# Patient Record
Sex: Female | Born: 1950 | Race: White | Hispanic: No | Smoking: Never smoker
Health system: Southern US, Community
[De-identification: ages and names within clinical notes are randomized; demographics above are authoritative.]

## PROBLEM LIST (undated history)

## (undated) DIAGNOSIS — E559 Vitamin D deficiency, unspecified: Secondary | ICD-10-CM

## (undated) DIAGNOSIS — C49A0 Gastrointestinal stromal tumor, unspecified site: Secondary | ICD-10-CM

## (undated) DIAGNOSIS — E079 Disorder of thyroid, unspecified: Secondary | ICD-10-CM

## (undated) DIAGNOSIS — M1990 Unspecified osteoarthritis, unspecified site: Secondary | ICD-10-CM

## (undated) DIAGNOSIS — D259 Leiomyoma of uterus, unspecified: Secondary | ICD-10-CM

## (undated) HISTORY — DX: Disorder of thyroid, unspecified: E07.9

## (undated) HISTORY — DX: Leiomyoma of uterus, unspecified: D25.9

## (undated) HISTORY — PX: DILATION AND CURETTAGE OF UTERUS: SHX78

## (undated) HISTORY — DX: Gastrointestinal stromal tumor, unspecified site: C49.A0

## (undated) HISTORY — DX: Vitamin D deficiency, unspecified: E55.9

## (undated) HISTORY — DX: Unspecified osteoarthritis, unspecified site: M19.90

---

## 2000-04-29 ENCOUNTER — Other Ambulatory Visit: Admission: RE | Admit: 2000-04-29 | Discharge: 2000-04-29 | Payer: Self-pay | Admitting: Obstetrics and Gynecology

## 2002-02-10 ENCOUNTER — Other Ambulatory Visit: Admission: RE | Admit: 2002-02-10 | Discharge: 2002-02-10 | Payer: Self-pay | Admitting: Obstetrics and Gynecology

## 2003-04-07 ENCOUNTER — Other Ambulatory Visit: Admission: RE | Admit: 2003-04-07 | Discharge: 2003-04-07 | Payer: Self-pay | Admitting: Obstetrics and Gynecology

## 2004-05-14 ENCOUNTER — Other Ambulatory Visit: Admission: RE | Admit: 2004-05-14 | Discharge: 2004-05-14 | Payer: Self-pay | Admitting: Obstetrics and Gynecology

## 2005-06-17 ENCOUNTER — Other Ambulatory Visit: Admission: RE | Admit: 2005-06-17 | Discharge: 2005-06-17 | Payer: Self-pay | Admitting: Obstetrics and Gynecology

## 2006-06-29 ENCOUNTER — Other Ambulatory Visit: Admission: RE | Admit: 2006-06-29 | Discharge: 2006-06-29 | Payer: Self-pay | Admitting: Obstetrics and Gynecology

## 2007-08-05 ENCOUNTER — Other Ambulatory Visit: Admission: RE | Admit: 2007-08-05 | Discharge: 2007-08-05 | Payer: Self-pay | Admitting: Obstetrics and Gynecology

## 2008-09-06 ENCOUNTER — Ambulatory Visit: Payer: Self-pay | Admitting: Gynecology

## 2009-11-06 ENCOUNTER — Ambulatory Visit: Payer: Self-pay | Admitting: Obstetrics and Gynecology

## 2009-11-06 ENCOUNTER — Other Ambulatory Visit: Admission: RE | Admit: 2009-11-06 | Discharge: 2009-11-06 | Payer: Self-pay | Admitting: Obstetrics and Gynecology

## 2009-11-07 ENCOUNTER — Ambulatory Visit: Payer: Self-pay | Admitting: Obstetrics and Gynecology

## 2009-11-13 ENCOUNTER — Encounter: Admission: RE | Admit: 2009-11-13 | Discharge: 2009-11-13 | Payer: Self-pay | Admitting: Obstetrics and Gynecology

## 2010-11-19 ENCOUNTER — Encounter: Payer: Self-pay | Admitting: Obstetrics and Gynecology

## 2010-11-28 ENCOUNTER — Other Ambulatory Visit: Payer: Self-pay | Admitting: Obstetrics and Gynecology

## 2010-11-28 NOTE — Telephone Encounter (Signed)
Patient has her yearly exam scheduled 01/22/11.  Last yearly was 11/06/09. Please advise regarding ok to refill. thanks

## 2011-01-02 ENCOUNTER — Encounter: Payer: Self-pay | Admitting: Gynecology

## 2011-01-02 DIAGNOSIS — E079 Disorder of thyroid, unspecified: Secondary | ICD-10-CM | POA: Insufficient documentation

## 2011-01-22 ENCOUNTER — Encounter: Payer: Self-pay | Admitting: Obstetrics and Gynecology

## 2011-02-07 ENCOUNTER — Ambulatory Visit (INDEPENDENT_AMBULATORY_CARE_PROVIDER_SITE_OTHER): Payer: BC Managed Care – PPO | Admitting: Obstetrics and Gynecology

## 2011-02-07 ENCOUNTER — Encounter: Payer: Self-pay | Admitting: Obstetrics and Gynecology

## 2011-02-07 VITALS — BP 124/72 | Ht 64.0 in | Wt 170.0 lb

## 2011-02-07 DIAGNOSIS — Z01419 Encounter for gynecological examination (general) (routine) without abnormal findings: Secondary | ICD-10-CM

## 2011-02-07 DIAGNOSIS — Z833 Family history of diabetes mellitus: Secondary | ICD-10-CM

## 2011-02-07 DIAGNOSIS — E039 Hypothyroidism, unspecified: Secondary | ICD-10-CM

## 2011-02-07 LAB — URINALYSIS W MICROSCOPIC + REFLEX CULTURE
Bilirubin Urine: NEGATIVE
Ketones, ur: NEGATIVE mg/dL
Specific Gravity, Urine: 1.015 (ref 1.005–1.030)
pH: 5.5 (ref 5.0–8.0)

## 2011-02-07 MED ORDER — SYNTHROID 112 MCG PO TABS: 112.0000 ug | ORAL_TABLET | Freq: Every day | ORAL | Status: DC

## 2011-02-07 NOTE — Progress Notes (Signed)
Patient came to see me today for her annual GYN exam. She is doing well without HRT. She is having no vaginal bleeding. She is having no pelvic pain. She is up-to-date on mammograms. She has elected to start bone densities at 63. We are treating her for hypothyroidism and she remains happy on her current dose of Synthroid. She has no other medical issues.  Physical examination: Anna Figueroa present. HEENT within normal limits. Neck: Thyroid not large. No masses. Supraclavicular nodes: not enlarged. Breasts: Examined in both sitting midline position. No skin changes and no masses. Abdomen: Soft no guarding rebound or masses or hernia. Pelvic: External: Within normal limits. BUS: Within normal limits. Vaginal:within normal limits. Good estrogen effect. No evidence of cystocele rectocele or enterocele. Cervix: clean. Uterus: Normal size and shape. Adnexa: No masses. Rectovaginal exam: Confirmatory and negative. Extremities: Within normal limits.  Assessment: Normal GYN exam. hypothyroidism  Plan: TSH checked patient call Monday for results before she refills her Synthroid.

## 2011-02-08 LAB — CBC WITH DIFFERENTIAL/PLATELET
Eosinophils Relative: 3 % (ref 0–5)
HCT: 43.6 % (ref 36.0–46.0)
Lymphocytes Relative: 29 % (ref 12–46)
Lymphs Abs: 1.5 10*3/uL (ref 0.7–4.0)
MCV: 91.6 fL (ref 78.0–100.0)
Monocytes Absolute: 0.3 10*3/uL (ref 0.1–1.0)
RBC: 4.76 MIL/uL (ref 3.87–5.11)
WBC: 5.3 10*3/uL (ref 4.0–10.5)

## 2012-02-13 ENCOUNTER — Encounter: Payer: Self-pay | Admitting: Gynecology

## 2012-02-17 ENCOUNTER — Encounter: Payer: Self-pay | Admitting: Gynecology

## 2012-03-08 ENCOUNTER — Other Ambulatory Visit (HOSPITAL_COMMUNITY)
Admission: RE | Admit: 2012-03-08 | Discharge: 2012-03-08 | Disposition: A | Discharge status: Home / Self Care | Payer: BC Managed Care – PPO | Source: Ambulatory Visit | Attending: Gynecology | Admitting: Gynecology

## 2012-03-08 ENCOUNTER — Ambulatory Visit (INDEPENDENT_AMBULATORY_CARE_PROVIDER_SITE_OTHER): Payer: BC Managed Care – PPO | Admitting: Gynecology

## 2012-03-08 ENCOUNTER — Encounter: Payer: Self-pay | Admitting: Gynecology

## 2012-03-08 VITALS — BP 120/74 | Ht 64.0 in | Wt 158.0 lb

## 2012-03-08 DIAGNOSIS — Z13220 Encounter for screening for lipoid disorders: Secondary | ICD-10-CM

## 2012-03-08 DIAGNOSIS — Z01419 Encounter for gynecological examination (general) (routine) without abnormal findings: Secondary | ICD-10-CM | POA: Insufficient documentation

## 2012-03-08 DIAGNOSIS — N952 Postmenopausal atrophic vaginitis: Secondary | ICD-10-CM

## 2012-03-08 DIAGNOSIS — E039 Hypothyroidism, unspecified: Secondary | ICD-10-CM

## 2012-03-08 DIAGNOSIS — Z1151 Encounter for screening for human papillomavirus (HPV): Secondary | ICD-10-CM | POA: Insufficient documentation

## 2012-03-08 DIAGNOSIS — E559 Vitamin D deficiency, unspecified: Secondary | ICD-10-CM | POA: Insufficient documentation

## 2012-03-08 LAB — CBC WITH DIFFERENTIAL/PLATELET
Eosinophils Relative: 2 % (ref 0–5)
HCT: 40.8 % (ref 36.0–46.0)
Lymphocytes Relative: 26 % (ref 12–46)
Lymphs Abs: 1.2 10*3/uL (ref 0.7–4.0)
MCV: 84.6 fL (ref 78.0–100.0)
Neutro Abs: 2.8 10*3/uL (ref 1.7–7.7)
Platelets: 262 10*3/uL (ref 150–400)
RBC: 4.82 MIL/uL (ref 3.87–5.11)
WBC: 4.5 10*3/uL (ref 4.0–10.5)

## 2012-03-08 LAB — HEMOGLOBIN A1C: Hgb A1c MFr Bld: 6.3 % — ABNORMAL HIGH (ref <5.7)

## 2012-03-08 LAB — COMPREHENSIVE METABOLIC PANEL
ALT: 15 U/L (ref 0–35)
Albumin: 4.2 g/dL (ref 3.5–5.2)
CO2: 30 mEq/L (ref 19–32)
Calcium: 9.3 mg/dL (ref 8.4–10.5)
Chloride: 105 mEq/L (ref 96–112)
Creat: 0.98 mg/dL (ref 0.50–1.10)
Potassium: 4.2 mEq/L (ref 3.5–5.3)
Total Protein: 6.7 g/dL (ref 6.0–8.3)

## 2012-03-08 LAB — TSH: TSH: 1.876 u[IU]/mL (ref 0.350–4.500)

## 2012-03-08 LAB — LIPID PANEL: LDL Cholesterol: 110 mg/dL — ABNORMAL HIGH (ref 0–99)

## 2012-03-08 NOTE — Patient Instructions (Signed)
Follow up for lab results Follow up for annual exam in one year

## 2012-03-08 NOTE — Addendum Note (Signed)
Addended by: Dayna Barker on: 03/08/2012 10:09 AM   Modules accepted: Orders

## 2012-03-08 NOTE — Progress Notes (Signed)
Anna Figueroa [birth year] [id number]        62 y.o.  [id number] for annual exam.  Former patient Dr. Eda Paschal is without complaints.  Past medical history,surgical history, medications, allergies, family history and social history were all reviewed and documented in the EPIC chart. ROS:  Was performed and pertinent positives and negatives are included in the history.  Exam: Kim assistant Filed Vitals:   03/08/12 0909  BP: 120/74  Height: 5\' 4"  (1.626 m)  Weight: 158 lb (71.668 kg)   General appearance  Normal Skin grossly normal Head/Neck normal with no cervical or supraclavicular adenopathy thyroid normal Lungs  clear Cardiac RR, without RMG Abdominal  soft, nontender, without masses, organomegaly or hernia. Well-healed midline scar Breasts  examined lying and sitting without masses, retractions, discharge or axillary adenopathy. Pelvic  Ext/BUS/vagina  normal with atrophic changes  Cervix  normal with atrophic changes  Uterus  anteverted, normal size, shape and contour, midline and mobile nontender   Adnexa  Without masses or tenderness    Anus and perineum  normal   Rectovaginal  normal sphincter tone without palpated masses or tenderness.    Assessment/Plan:  62 y.o. [id number] female for annual exam.   1. Postmenopausal with atrophic genital changes. Patient asymptomatic without significant hot flushes, night sweats or vaginal symptoms. No bleeding. We'll continue to observe. Patient knows to report any bleeding. 2. Hypothyroidism. Patient was diagnosed with hypothyroidism and is on thyroid replacement. We'll check TSH today. Continue on Synthroid as prescribed. 3. Glucose control. Patient had a hemoglobin A1c of 6.1 last year. Does have a family history of diabetes. Was using diet and exercise to control. We'll check glucose and hemoglobin A1c now. 4. Mammography 01/2012. Continue with annual mammography. SBE monthly reviewed. 5. Pap smear 10/2009. Pap/HPV done today. No  history of significant abnormalities. Plan five-year repeat assuming this Pap smear is negative. 6. DEXA. Patient's never had a DEXA and plans to do at age 12. She's discussed this with Dr. Eda Paschal and this is what they agreed upon. I did review the risks of osteoporosis and fracture. She has no strong risk factors. We'll check vitamin D level today. Calcium vitamin D discussed. 7. Colonoscopy. Patients never had a colonoscopy implants at age 34. I reviewed current screening guidelines to start at age 25. I reviewed the benefit of early detection and removal of precancerous lesions. Patient clearly understands and declines colonoscopy.  OC Light kit given to check her stool for blood. Patient knows importance of returning this in calling to make sure we've received it and it is negative. 8. Health maintenance. Patient has no primary physician. I did discuss obtaining one sometime in the next year or 2. We'll check a CBC comprehensive metabolic panel lipid profile glucose TSH vitamin D and hepatitis C screen as she was born in the at risk age interval.      Dara Lords MD, 9:51 AM 03/08/2012

## 2012-03-09 LAB — URINALYSIS W MICROSCOPIC + REFLEX CULTURE
Bacteria, UA: NONE SEEN
Bilirubin Urine: NEGATIVE
Casts: NONE SEEN
Crystals: NONE SEEN
Glucose, UA: NEGATIVE mg/dL
Hgb urine dipstick: NEGATIVE
Ketones, ur: NEGATIVE mg/dL
Specific Gravity, Urine: 1.021 (ref 1.005–1.030)
pH: 6 (ref 5.0–8.0)

## 2012-05-31 ENCOUNTER — Other Ambulatory Visit: Payer: Self-pay | Admitting: Gynecology

## 2013-03-02 DIAGNOSIS — Z0289 Encounter for other administrative examinations: Secondary | ICD-10-CM

## 2013-07-06 ENCOUNTER — Telehealth: Payer: Self-pay | Admitting: *Deleted

## 2013-07-06 DIAGNOSIS — Z01419 Encounter for gynecological examination (general) (routine) without abnormal findings: Secondary | ICD-10-CM

## 2013-07-06 DIAGNOSIS — R5383 Other fatigue: Principal | ICD-10-CM

## 2013-07-06 DIAGNOSIS — R5381 Other malaise: Secondary | ICD-10-CM

## 2013-07-06 NOTE — Telephone Encounter (Signed)
Okay to have drawn before her appointment. If no one else is doing routine lab work then I would suggest coming for a fasting CBC, comprehensive metabolic panel, lipid profile, hemoglobin A1c TSH, vitamin D.

## 2013-07-06 NOTE — Telephone Encounter (Signed)
Pt has annual scheduled 08/26/13 would like to have TSH level checked before then. Pt c/o sluggish, thoughts are that her level maybe off. Please advise

## 2013-07-07 NOTE — Telephone Encounter (Signed)
Patient informed and labs put in.

## 2013-07-11 ENCOUNTER — Other Ambulatory Visit: Payer: BC Managed Care – PPO

## 2013-07-11 ENCOUNTER — Other Ambulatory Visit: Payer: Self-pay | Admitting: Gynecology

## 2013-07-11 DIAGNOSIS — R5383 Other fatigue: Principal | ICD-10-CM

## 2013-07-11 DIAGNOSIS — R5381 Other malaise: Secondary | ICD-10-CM

## 2013-07-12 ENCOUNTER — Telehealth: Payer: Self-pay

## 2013-07-12 ENCOUNTER — Other Ambulatory Visit: Payer: Self-pay | Admitting: Gynecology

## 2013-07-12 DIAGNOSIS — D72819 Decreased white blood cell count, unspecified: Secondary | ICD-10-CM

## 2013-07-12 DIAGNOSIS — E782 Mixed hyperlipidemia: Secondary | ICD-10-CM

## 2013-07-12 DIAGNOSIS — D696 Thrombocytopenia, unspecified: Secondary | ICD-10-CM

## 2013-07-12 DIAGNOSIS — E781 Pure hyperglyceridemia: Secondary | ICD-10-CM

## 2013-07-12 LAB — CBC WITH DIFFERENTIAL/PLATELET
BASOS PCT: 3 % — AB (ref 0–1)
Basophils Absolute: 0.1 10*3/uL (ref 0.0–0.1)
EOS ABS: 0.1 10*3/uL (ref 0.0–0.7)
Eosinophils Relative: 2 % (ref 0–5)
HCT: 41.3 % (ref 36.0–46.0)
HEMOGLOBIN: 13.8 g/dL (ref 12.0–15.0)
Lymphocytes Relative: 55 % — ABNORMAL HIGH (ref 12–46)
Lymphs Abs: 2.1 10*3/uL (ref 0.7–4.0)
MCH: 28.2 pg (ref 26.0–34.0)
MCHC: 33.4 g/dL (ref 30.0–36.0)
MCV: 84.5 fL (ref 78.0–100.0)
MONOS PCT: 20 % — AB (ref 3–12)
Monocytes Absolute: 0.8 10*3/uL (ref 0.1–1.0)
NEUTROS PCT: 20 % — AB (ref 43–77)
Neutro Abs: 0.8 10*3/uL — ABNORMAL LOW (ref 1.7–7.7)
Platelets: 133 10*3/uL — ABNORMAL LOW (ref 150–400)
RBC: 4.89 MIL/uL (ref 3.87–5.11)
RDW: 14.2 % (ref 11.5–15.5)
WBC: 3.8 10*3/uL — ABNORMAL LOW (ref 4.0–10.5)

## 2013-07-12 LAB — COMPREHENSIVE METABOLIC PANEL
ALK PHOS: 66 U/L (ref 39–117)
ALT: 42 U/L — ABNORMAL HIGH (ref 0–35)
AST: 61 U/L — AB (ref 0–37)
Albumin: 3.8 g/dL (ref 3.5–5.2)
BUN: 19 mg/dL (ref 6–23)
CO2: 27 mEq/L (ref 19–32)
CREATININE: 0.97 mg/dL (ref 0.50–1.10)
Calcium: 8.8 mg/dL (ref 8.4–10.5)
Chloride: 103 mEq/L (ref 96–112)
Glucose, Bld: 105 mg/dL — ABNORMAL HIGH (ref 70–99)
POTASSIUM: 4.2 meq/L (ref 3.5–5.3)
Sodium: 141 mEq/L (ref 135–145)
Total Bilirubin: 0.9 mg/dL (ref 0.2–1.2)
Total Protein: 6.4 g/dL (ref 6.0–8.3)

## 2013-07-12 LAB — HEMOGLOBIN A1C
HEMOGLOBIN A1C: 6.2 % — AB (ref <5.7)
Mean Plasma Glucose: 131 mg/dL — ABNORMAL HIGH (ref <117)

## 2013-07-12 LAB — LIPID PANEL
CHOLESTEROL: 179 mg/dL (ref 0–200)
HDL: 45 mg/dL (ref >39)
LDL CALC: 93 mg/dL (ref 0–99)
Total CHOL/HDL Ratio: 4 Ratio
Triglycerides: 205 mg/dL — ABNORMAL HIGH (ref <150)
VLDL: 41 mg/dL — AB (ref 0–40)

## 2013-07-12 LAB — TSH: TSH: 3.135 u[IU]/mL (ref 0.350–4.500)

## 2013-07-12 LAB — VITAMIN D 25 HYDROXY (VIT D DEFICIENCY, FRACTURES): VIT D 25 HYDROXY: 45 ng/mL (ref 30–89)

## 2013-07-12 NOTE — Telephone Encounter (Signed)
Message copied by Ramond Craver on Tue Jul 12, 2013  2:58 PM ------      Message from: Anastasio Auerbach      Created: Tue Jul 12, 2013  7:53 AM       Tell patient:      #1 her platelet count and white blood cell count or little low. Recommend repeating a CBC.      #2 cholesterol and LDL look good. Her triglycerides are elevated but this was not fasting that would be okay. If it was fasting would recommend repeating a fasting lipid profile in 3-6 months with attention to fat in her diet. She may want to add omega-3 fish oil OTC      #3 her glucose and her hemoglobin A1c are elevated again consistent with diabetes. She needs to make an appointment to be seen by either an internist or endocrinologist to be followed for this and possibly treated. This is very important for her overall health. ------

## 2013-07-12 NOTE — Telephone Encounter (Signed)
Patient advised.

## 2013-07-12 NOTE — Telephone Encounter (Signed)
Patient questions is you could recommend an internist or PCP for her?

## 2013-07-12 NOTE — Telephone Encounter (Signed)
One of the newer physicians at [facility]

## 2013-07-15 ENCOUNTER — Other Ambulatory Visit: Payer: Self-pay | Admitting: Gynecology

## 2013-07-15 DIAGNOSIS — R7989 Other specified abnormal findings of blood chemistry: Secondary | ICD-10-CM

## 2013-07-15 DIAGNOSIS — R945 Abnormal results of liver function studies: Principal | ICD-10-CM

## 2013-08-01 ENCOUNTER — Telehealth: Payer: Self-pay | Admitting: *Deleted

## 2013-08-01 NOTE — Telephone Encounter (Signed)
I called [place] office and left a message for referral coordinator Di Kindle to call regarding this new patient.

## 2013-08-01 NOTE — Telephone Encounter (Signed)
Pt has found a PCP Dr.Daniel Sharlett Iles which is not accepting new patients with out referral. Pt asked if you would be willing to refer her? Pt will have medicare insurance soon.  Please advise

## 2013-08-01 NOTE — Telephone Encounter (Signed)
Okay for referral?

## 2013-08-01 NOTE — Telephone Encounter (Signed)
Di Kindle called back and asked me to fax office notes for  Dr.Paterson to review and Arville Go will call me back with answer regarding referral. Pt aware of the below note.

## 2013-08-16 NOTE — Telephone Encounter (Signed)
Appointment Jan 2016 per [place] office.

## 2013-08-26 ENCOUNTER — Encounter: Payer: Self-pay | Admitting: Gynecology

## 2013-08-26 ENCOUNTER — Ambulatory Visit (INDEPENDENT_AMBULATORY_CARE_PROVIDER_SITE_OTHER): Payer: BC Managed Care – PPO | Admitting: Gynecology

## 2013-08-26 VITALS — BP 134/80 | Ht 64.0 in | Wt 149.0 lb

## 2013-08-26 DIAGNOSIS — Z01419 Encounter for gynecological examination (general) (routine) without abnormal findings: Secondary | ICD-10-CM

## 2013-08-26 LAB — COMPREHENSIVE METABOLIC PANEL
ALT: 17 U/L (ref 0–35)
AST: 22 U/L (ref 0–37)
Albumin: 4.3 g/dL (ref 3.5–5.2)
Alkaline Phosphatase: 54 U/L (ref 39–117)
BUN: 20 mg/dL (ref 6–23)
CALCIUM: 9.4 mg/dL (ref 8.4–10.5)
CHLORIDE: 103 meq/L (ref 96–112)
CO2: 27 meq/L (ref 19–32)
CREATININE: 0.99 mg/dL (ref 0.50–1.10)
Glucose, Bld: 94 mg/dL (ref 70–99)
Potassium: 4.4 mEq/L (ref 3.5–5.3)
Sodium: 138 mEq/L (ref 135–145)
Total Bilirubin: 1.2 mg/dL (ref 0.2–1.2)
Total Protein: 7.1 g/dL (ref 6.0–8.3)

## 2013-08-26 LAB — CBC WITH DIFFERENTIAL/PLATELET
BASOS ABS: 0.1 10*3/uL (ref 0.0–0.1)
Basophils Relative: 1 % (ref 0–1)
Eosinophils Absolute: 0.2 10*3/uL (ref 0.0–0.7)
Eosinophils Relative: 4 % (ref 0–5)
HEMATOCRIT: 39.5 % (ref 36.0–46.0)
HEMOGLOBIN: 13.4 g/dL (ref 12.0–15.0)
LYMPHS PCT: 36 % (ref 12–46)
Lymphs Abs: 1.8 10*3/uL (ref 0.7–4.0)
MCH: 28.8 pg (ref 26.0–34.0)
MCHC: 33.9 g/dL (ref 30.0–36.0)
MCV: 84.9 fL (ref 78.0–100.0)
MONO ABS: 0.4 10*3/uL (ref 0.1–1.0)
MONOS PCT: 8 % (ref 3–12)
NEUTROS ABS: 2.6 10*3/uL (ref 1.7–7.7)
Neutrophils Relative %: 51 % (ref 43–77)
Platelets: 226 10*3/uL (ref 150–400)
RBC: 4.65 MIL/uL (ref 3.87–5.11)
RDW: 14.5 % (ref 11.5–15.5)
WBC: 5 10*3/uL (ref 4.0–10.5)

## 2013-08-26 MED ORDER — SYNTHROID 112 MCG PO TABS: ORAL_TABLET | ORAL | Status: AC

## 2013-08-26 NOTE — Progress Notes (Signed)
Anna Figueroa 07-Dec-1950 [id number]        62 y.o.  [id number] for annual exam.  Several issues noted below.  Past medical history,surgical history, problem list, medications, allergies, family history and social history were all reviewed and documented as reviewed in the EPIC chart.  ROS:  12 system ROS performed with pertinent positives and negatives included in the history, assessment and plan.   Additional significant findings :  None   Exam: Kim Counsellor Vitals:   08/26/13 0831  BP: 134/80  Height: 5\' 4"  (1.626 m)  Weight: 149 lb (67.586 kg)   General appearance:  Normal affect, orientation and appearance. Skin: Grossly normal HEENT: Without gross lesions.  No cervical or supraclavicular adenopathy. Thyroid normal.  Lungs:  Clear without wheezing, rales or rhonchi Cardiac: RR, without RMG Abdominal:  Soft, nontender, without masses, guarding, rebound, organomegaly or hernia Breasts:  Examined lying and sitting without masses, retractions, discharge or axillary adenopathy. Pelvic:  Ext/BUS/vagina with generalized atrophic changes  Cervix with atrophic changes  Uterus anteverted, normal size, shape and contour, midline and mobile nontender   Adnexa  Without masses or tenderness    Anus and perineum  Normal   Rectovaginal  Normal sphincter tone without palpated masses or tenderness.    Assessment/Plan:  63 y.o. [id number] female for annual exam. .   1. Postmenopausal/atrophic genital changes. Patient without significant symptoms of hot flushes, night sweats, vaginal dryness or dyspareunia. No vaginal bleeding. Will continue to monitor. Report any vaginal bleeding. 2. Hypothyroid. Recent TSH within normal range. I refilled her Synthroid x1 year. 3. Pap/HPV negative 2014. No Pap smear done today. No history of significant abnormal Pap smears. Plan repeat Pap smear at 3-5 year interval. 4. Mammography 01/2012. Schedule screening mammogram. SBE monthly reviewed. 5. DEXA  never. Patient plans this coming year after she turned 20. Increased calcium vitamin D reviewed. Vitamin D level 45 06/2013. 6. Colonoscopy never. Patient plans to when she turned 89. We previously reviewed screening recommendations and she has always declined until now. 7. Health maintenance. Recent lab work showed elevated transaminases, low white count and platelet count. Will recheck comprehensive metabolic panel and CBC today. She does have an appointment to see internal medicine doctor will followup with him in reference to this. Glucoses and hemoglobin A1c have all so been marginally elevated and she knows importance of followup for this. Lastly her blood pressure is 134/80 she'll continue to monitor this and follow up with internal medicine. Followup here in one year, sooner as needed.    Note: This document was prepared with digital dictation and possible smart phrase technology. Any transcriptional errors that result from this process are unintentional.   Anastasio Auerbach MD, 8:58 AM 08/26/2013

## 2013-08-26 NOTE — Patient Instructions (Addendum)
Followup for lab work.  Followup with your appointment with the internal medicine doctor.  Call to Schedule your mammogram  Facilities in [place]: 1)  [facility], [address] Phone: [phone] 2)  [facility]. [address] [place] Phone: [phone] 3)  Dr. Isaiah Blakes at [facility] [address] Phone: [phone]     Mammogram A mammogram is an X-ray test to find changes in a woman's breast. You should get a mammogram if:  You are 76 years of age or older  You have risk factors.   Your doctor recommends that you have one.  BEFORE THE TEST  Do not schedule the test the week before your period, especially if your breasts are sore during this time.  On the day of your mammogram:  Wash your breasts and armpits well. After washing, do not put on any deodorant or talcum powder on until after your test.   Eat and drink as you usually do.   Take your medicines as usual.   If you are diabetic and take insulin, make sure you:   Eat before coming for your test.   Take your insulin as usual.   If you cannot keep your appointment, call before the appointment to cancel. Schedule another appointment.  TEST  You will need to undress from the waist up. You will put on a hospital gown.   Your breast will be put on the mammogram machine, and it will press firmly on your breast with a piece of plastic called a compression paddle. This will make your breast flatter so that the machine can X-ray all parts of your breast.   Both breasts will be X-rayed. Each breast will be X-rayed from above and from the side. An X-ray might need to be taken again if the picture is not good enough.   The mammogram will last about 15 to 30 minutes.  AFTER THE TEST Finding out the results of your test Ask when your test results will be ready. Make sure you get your test results.  Document  Released: 04/11/2008 Document Revised: 01/02/2011 Document Reviewed: 04/11/2008 [facility] Patient Information 2012 [place].   You may obtain a copy of any labs that were done today by logging onto MyChart as outlined in the instructions provided with your AVS (after visit summary). The office will not call with normal lab results but certainly if there are any significant abnormalities then we will contact you.   Health Maintenance, Female A healthy lifestyle and preventative care can promote health and wellness.  Maintain regular health, dental, and eye exams.  Eat a healthy diet. Foods like vegetables, fruits, whole grains, low-fat dairy products, and lean protein foods contain the nutrients you need without too many calories. Decrease your intake of foods high in solid fats, added sugars, and salt. Get information about a proper diet from your caregiver, if necessary.  Regular physical exercise is one of the most important things you can do for your health. Most adults should get at least 150 minutes of moderate-intensity exercise (any activity that increases your heart rate and causes you to sweat) each week. In addition, most adults need muscle-strengthening exercises on 2 or more days a week.   Maintain a healthy weight. The body mass index (BMI) is a screening tool to identify possible weight problems. It provides an estimate of body fat based on height and weight. Your caregiver can  help determine your BMI, and can help you achieve or maintain a healthy weight. For adults 20 years and older:  A BMI below 18.5 is considered underweight.  A BMI of 18.5 to 24.9 is normal.  A BMI of 25 to 29.9 is considered overweight.  A BMI of 30 and above is considered obese.  Maintain normal blood lipids and cholesterol by exercising and minimizing your intake of saturated fat. Eat a balanced diet with plenty of fruits and vegetables. Blood tests for lipids and cholesterol should begin at age  64 and be repeated every 5 years. If your lipid or cholesterol levels are high, you are over 50, or you are a high risk for heart disease, you may need your cholesterol levels checked more frequently.Ongoing high lipid and cholesterol levels should be treated with medicines if diet and exercise are not effective.  If you smoke, find out from your caregiver how to quit. If you do not use tobacco, do not start.  Lung cancer screening is recommended for adults aged 50 80 years who are at high risk for developing lung cancer because of a history of smoking. Yearly low-dose computed tomography (CT) is recommended for people who have at least a 30-pack-year history of smoking and are a current smoker or have quit within the past 15 years. A pack year of smoking is smoking an average of 1 pack of cigarettes a day for 1 year (for example: 1 pack a day for 30 years or 2 packs a day for 15 years). Yearly screening should continue until the smoker has stopped smoking for at least 15 years. Yearly screening should also be stopped for people who develop a health problem that would prevent them from having lung cancer treatment.  If you are pregnant, do not drink alcohol. If you are breastfeeding, be very cautious about drinking alcohol. If you are not pregnant and choose to drink alcohol, do not exceed 1 drink per day. One drink is considered to be 12 ounces (355 mL) of beer, 5 ounces (148 mL) of wine, or 1.5 ounces (44 mL) of liquor.  Avoid use of street drugs. Do not share needles with anyone. Ask for help if you need support or instructions about stopping the use of drugs.  High blood pressure causes heart disease and increases the risk of stroke. Blood pressure should be checked at least every 1 to 2 years. Ongoing high blood pressure should be treated with medicines, if weight loss and exercise are not effective.  If you are 64 to 65 years old, ask your caregiver if you should take aspirin to prevent  strokes.  Diabetes screening involves taking a blood sample to check your fasting blood sugar level. This should be done once every 3 years, after age 4, if you are within normal weight and without risk factors for diabetes. Testing should be considered at a younger age or be carried out more frequently if you are overweight and have at least 1 risk factor for diabetes.  Breast cancer screening is essential preventative care for women. You should practice "breast self-awareness." This means understanding the normal appearance and feel of your breasts and may include breast self-examination. Any changes detected, no matter how small, should be reported to a caregiver. Women in their 22s and 30s should have a clinical breast exam (CBE) by a caregiver as part of a regular health exam every 1 to 3 years. After age 91, women should have a CBE every year. Starting at  age 3, women should consider having a mammogram (breast X-ray) every year. Women who have a family history of breast cancer should talk to their caregiver about genetic screening. Women at a high risk of breast cancer should talk to their caregiver about having an MRI and a mammogram every year.  Breast cancer gene (BRCA)-related cancer risk assessment is recommended for women who have family members with BRCA-related cancers. BRCA-related cancers include breast, ovarian, tubal, and peritoneal cancers. Having family members with these cancers may be associated with an increased risk for harmful changes (mutations) in the breast cancer genes BRCA1 and BRCA2. Results of the assessment will determine the need for genetic counseling and BRCA1 and BRCA2 testing.  The Pap test is a screening test for cervical cancer. Women should have a Pap test starting at age 20. Between ages 61 and 37, Pap tests should be repeated every 2 years. Beginning at age 64, you should have a Pap test every 3 years as long as the past 3 Pap tests have been normal. If you had a  hysterectomy for a problem that was not cancer or a condition that could lead to cancer, then you no longer need Pap tests. If you are between ages 54 and 83, and you have had normal Pap tests going back 10 years, you no longer need Pap tests. If you have had past treatment for cervical cancer or a condition that could lead to cancer, you need Pap tests and screening for cancer for at least 20 years after your treatment. If Pap tests have been discontinued, risk factors (such as a new sexual partner) need to be reassessed to determine if screening should be resumed. Some women have medical problems that increase the chance of getting cervical cancer. In these cases, your caregiver may recommend more frequent screening and Pap tests.  The human papillomavirus (HPV) test is an additional test that may be used for cervical cancer screening. The HPV test looks for the virus that can cause the cell changes on the cervix. The cells collected during the Pap test can be tested for HPV. The HPV test could be used to screen women aged 89 years and older, and should be used in women of any age who have unclear Pap test results. After the age of 34, women should have HPV testing at the same frequency as a Pap test.  Colorectal cancer can be detected and often prevented. Most routine colorectal cancer screening begins at the age of 20 and continues through age 51. However, your caregiver may recommend screening at an earlier age if you have risk factors for colon cancer. On a yearly basis, your caregiver may provide home test kits to check for hidden blood in the stool. Use of a small camera at the end of a tube, to directly examine the colon (sigmoidoscopy or colonoscopy), can detect the earliest forms of colorectal cancer. Talk to your caregiver about this at age 27, when routine screening begins. Direct examination of the colon should be repeated every 5 to 10 years through age 55, unless early forms of pre-cancerous  polyps or small growths are found.  Hepatitis C blood testing is recommended for all people born from [birth year] through 1967 and any individual with known risks for hepatitis C.  Practice safe sex. Use condoms and avoid high-risk sexual practices to reduce the spread of sexually transmitted infections (STIs). Sexually active women aged 69 and younger should be checked for Chlamydia, which is a common sexually transmitted  infection. Older women with new or multiple partners should also be tested for Chlamydia. Testing for other STIs is recommended if you are sexually active and at increased risk.  Osteoporosis is a disease in which the bones lose minerals and strength with aging. This can result in serious bone fractures. The risk of osteoporosis can be identified using a bone density scan. Women ages 35 and over and women at risk for fractures or osteoporosis should discuss screening with their caregivers. Ask your caregiver whether you should be taking a calcium supplement or vitamin D to reduce the rate of osteoporosis.  Menopause can be associated with physical symptoms and risks. Hormone replacement therapy is available to decrease symptoms and risks. You should talk to your caregiver about whether hormone replacement therapy is right for you.  Use sunscreen. Apply sunscreen liberally and repeatedly throughout the day. You should seek shade when your shadow is shorter than you. Protect yourself by wearing long sleeves, pants, a wide-brimmed hat, and sunglasses year round, whenever you are outdoors.  Notify your caregiver of new moles or changes in moles, especially if there is a change in shape or color. Also notify your caregiver if a mole is larger than the size of a pencil eraser.  Stay current with your immunizations. Document Released: 07/29/2010 Document Revised: 05/10/2012 Document Reviewed: 07/29/2010 [facility] Patient Information 2014 [place].

## 2013-08-30 ENCOUNTER — Encounter: Payer: Self-pay | Admitting: Obstetrics and Gynecology

## 2013-11-28 ENCOUNTER — Encounter: Payer: Self-pay | Admitting: Gynecology

## 2014-07-21 ENCOUNTER — Encounter: Payer: Self-pay | Admitting: Gynecology

## 2014-11-17 ENCOUNTER — Other Ambulatory Visit: Payer: Self-pay | Admitting: Gynecology

## 2015-01-28 DIAGNOSIS — D219 Benign neoplasm of connective and other soft tissue, unspecified: Secondary | ICD-10-CM

## 2015-01-28 HISTORY — DX: Benign neoplasm of connective and other soft tissue, unspecified: D21.9

## 2015-12-28 DIAGNOSIS — H353131 Nonexudative age-related macular degeneration, bilateral, early dry stage: Secondary | ICD-10-CM | POA: Diagnosis not present

## 2015-12-28 DIAGNOSIS — H43813 Vitreous degeneration, bilateral: Secondary | ICD-10-CM | POA: Diagnosis not present

## 2015-12-28 DIAGNOSIS — H2513 Age-related nuclear cataract, bilateral: Secondary | ICD-10-CM | POA: Diagnosis not present

## 2015-12-28 DIAGNOSIS — H04123 Dry eye syndrome of bilateral lacrimal glands: Secondary | ICD-10-CM | POA: Diagnosis not present

## 2016-01-11 DIAGNOSIS — R7309 Other abnormal glucose: Secondary | ICD-10-CM | POA: Diagnosis not present

## 2016-01-11 DIAGNOSIS — E038 Other specified hypothyroidism: Secondary | ICD-10-CM | POA: Diagnosis not present

## 2016-01-11 DIAGNOSIS — E559 Vitamin D deficiency, unspecified: Secondary | ICD-10-CM | POA: Diagnosis not present

## 2016-01-18 DIAGNOSIS — R7309 Other abnormal glucose: Secondary | ICD-10-CM | POA: Diagnosis not present

## 2016-01-18 DIAGNOSIS — E038 Other specified hypothyroidism: Secondary | ICD-10-CM | POA: Diagnosis not present

## 2016-01-18 DIAGNOSIS — M1990 Unspecified osteoarthritis, unspecified site: Secondary | ICD-10-CM | POA: Diagnosis not present

## 2016-01-18 DIAGNOSIS — Z6826 Body mass index (BMI) 26.0-26.9, adult: Secondary | ICD-10-CM | POA: Diagnosis not present

## 2016-01-18 DIAGNOSIS — Z0000 Encounter for general adult medical examination without abnormal findings: Secondary | ICD-10-CM | POA: Diagnosis not present

## 2016-01-18 DIAGNOSIS — E559 Vitamin D deficiency, unspecified: Secondary | ICD-10-CM | POA: Diagnosis not present

## 2016-01-18 DIAGNOSIS — I10 Essential (primary) hypertension: Secondary | ICD-10-CM | POA: Diagnosis not present

## 2016-01-18 DIAGNOSIS — Z1389 Encounter for screening for other disorder: Secondary | ICD-10-CM | POA: Diagnosis not present

## 2016-01-28 HISTORY — PX: SALPINGECTOMY: SHX328

## 2016-01-28 HISTORY — PX: OOPHORECTOMY: SHX86

## 2016-01-28 HISTORY — PX: SIGMOIDECTOMY: SHX176

## 2016-01-28 HISTORY — PX: OTHER SURGICAL HISTORY: SHX169

## 2016-02-14 ENCOUNTER — Encounter: Payer: Self-pay | Admitting: Gynecology

## 2016-02-14 DIAGNOSIS — Z1212 Encounter for screening for malignant neoplasm of rectum: Secondary | ICD-10-CM | POA: Diagnosis not present

## 2016-02-19 ENCOUNTER — Encounter: Payer: Self-pay | Admitting: Gynecology

## 2016-02-19 ENCOUNTER — Ambulatory Visit (INDEPENDENT_AMBULATORY_CARE_PROVIDER_SITE_OTHER): Payer: Medicare Other | Admitting: Gynecology

## 2016-02-19 VITALS — BP 124/80 | Ht 64.0 in | Wt 157.0 lb

## 2016-02-19 DIAGNOSIS — R1900 Intra-abdominal and pelvic swelling, mass and lump, unspecified site: Secondary | ICD-10-CM

## 2016-02-19 DIAGNOSIS — N952 Postmenopausal atrophic vaginitis: Secondary | ICD-10-CM

## 2016-02-19 DIAGNOSIS — Z01411 Encounter for gynecological examination (general) (routine) with abnormal findings: Secondary | ICD-10-CM

## 2016-02-19 NOTE — Patient Instructions (Signed)
Office will call to arrange for the MRI

## 2016-02-19 NOTE — Progress Notes (Signed)
    Anna Figueroa [birth year] [id number]        65 y.o.  [id number] for breast and pelvic exam. For the past several months patient has noticed a mass palpated above her pubic bone to the lower abdomen. She'll notice it when she turns in bed or moves in a certain direction. Also noticing some increasing constipation. No pain vaginal bleeding or other symptoms. She does have a history of leiomyoma noted years ago measuring 3, 2 and 1 cm pelvic exam was normal last year  Past medical history,surgical history, problem list, medications, allergies, family history and social history were all reviewed and documented as reviewed in the EPIC chart.  ROS:  Performed with pertinent positives and negatives included in the history, assessment and plan.   Additional significant findings :  None   Exam: Caryn Bee assistant Vitals:   02/19/16 1615  BP: 124/80  Weight: 157 lb (71.2 kg)  Height: 5\' 4"  (1.626 m)   Body mass index is 26.95 kg/m.  General appearance:  Normal affect, orientation and appearance. Skin: Grossly normal HEENT: Without gross lesions.  No cervical or supraclavicular adenopathy. Thyroid normal.  Lungs:  Clear without wheezing, rales or rhonchi Cardiac: RR, without RMG Abdominal:  Soft, nontender, without masses, guarding, rebound, organomegaly or hernia Breasts:  Examined lying and sitting without masses, retractions, discharge or axillary adenopathy. Pelvic:  Ext, BUS, Vagina with atrophic changes  Cervix with atrophic changes  16 week size firm midline pelvic mass filling the pelvis noted. Unable to differentiate from uterus. Nontender on palpation.   Anus and perineum normal   Rectovaginal normal sphincter tone without palpated masses or tenderness.    Assessment/Plan:  66 y.o. [id number] female for breast and pelvic exam.   1. Pelvic mass. Patient does have history of leiomyoma which were all very small noted years ago. Reviewed with patient unlikely that benign  leiomyoma would grow at this point at age 50. She's not having any vaginal bleeding. Possibilities to include malignancy were reviewed with her. We'll check baseline CA-125 now and schedule MRI of the pelvis. Possible referral to gynecologic oncologist discussed. Patient will follow up for the MRI and then we'll go from there. 2. Mammography 06/2014. Patient knows she is overdue and agrees to call and schedule. SBE monthly reviewed. 3. Pap smear/HPV 02/2012. No Pap smear done today. No history of significant abnormal Pap smears. 4. Colonoscopy never. Patient knows that she should have a screening colonoscopy in the issues with colon cancer. At this point refuses to schedule. 5. DEXA reported through Dr. Buel Ream office 2016. She'll continue to follow up with them in reference to bone health. 6. Health maintenance. No routine lab work done as patient does this elsewhere. Patient will follow up for her CA-125 and MRI results.  Additional time in excess of her breast and pelvic exam was spent in direct face to face counseling and coordination of care in regards to her pelvic mass.    Anastasio Auerbach MD, 4:59 PM 02/19/2016

## 2016-02-20 LAB — CA 125: CA 125: 32 U/mL (ref <35)

## 2016-02-21 ENCOUNTER — Telehealth: Payer: Self-pay | Admitting: *Deleted

## 2016-02-21 DIAGNOSIS — R1900 Intra-abdominal and pelvic swelling, mass and lump, unspecified site: Secondary | ICD-10-CM

## 2016-02-21 NOTE — Telephone Encounter (Signed)
Pt scheduled at [facility] on 02/27/16 @ 10:30am

## 2016-02-21 NOTE — Telephone Encounter (Signed)
-----   Message from Anastasio Auerbach, MD sent at 02/19/2016  5:07 PM EST ----- Schedule MRI of the pelvis with and without, reference large pelvic mass. Sooner the better

## 2016-02-21 NOTE — Telephone Encounter (Signed)
Pt preferred to have MRI scheduled at [facility] imaging, order has been placed, [facility] imaging aware order in epic,they will call her to schedule. Pt aware of this as well.

## 2016-02-22 ENCOUNTER — Telehealth: Payer: Self-pay

## 2016-02-22 NOTE — Telephone Encounter (Signed)
Dr. Phineas Real spoke with [employer] regarding peer to peer review for MRI. Was provided authorization number [id number] valid until 03/21/16.  Note added to appt notes.

## 2016-02-22 NOTE — Telephone Encounter (Signed)
Encounter opened in error

## 2016-02-27 ENCOUNTER — Telehealth: Payer: Self-pay | Admitting: Gynecology

## 2016-02-27 ENCOUNTER — Ambulatory Visit (HOSPITAL_COMMUNITY): Payer: Self-pay

## 2016-02-27 ENCOUNTER — Ambulatory Visit
Admission: RE | Admit: 2016-02-27 | Discharge: 2016-02-27 | Disposition: A | Discharge status: Home / Self Care | Payer: Medicare Other | Source: Ambulatory Visit | Attending: Gynecology | Admitting: Gynecology

## 2016-02-27 ENCOUNTER — Telehealth: Payer: Self-pay | Admitting: *Deleted

## 2016-02-27 DIAGNOSIS — R1907 Generalized intra-abdominal and pelvic swelling, mass and lump: Secondary | ICD-10-CM | POA: Diagnosis not present

## 2016-02-27 DIAGNOSIS — R1900 Intra-abdominal and pelvic swelling, mass and lump, unspecified site: Secondary | ICD-10-CM

## 2016-02-27 MED ORDER — GADOBENATE DIMEGLUMINE 529 MG/ML IV SOLN
14.0000 mL | Freq: Once | INTRAVENOUS | Status: AC | PRN
  Administered 2016-02-27: 14 mL via INTRAVENOUS

## 2016-02-27 NOTE — Telephone Encounter (Signed)
I called the patient today with the results of her MRI that showed "large pelvic soft tissue mass which surrounds the uterus and involves both adnexal regions, left side greater than the right. Small mass also seen in the anterior right lower quadrant suspicious for peritoneal tumor implant. Minimal ascites."  I reviewed the differential to include carcinoma GYN versus non-GYN. Certainly appears to be probable ovarian. Her CA-125 did return normal at 32.  MRI did not appear to show enlarged lymph nodes. Recommended patient follow up ASAP with gynecologic oncologist and we will make these arrangements for her. She knows to call my office if she does not hear about scheduling an appointment over the next week or so.

## 2016-02-27 NOTE — Telephone Encounter (Signed)
[facility] called regarding MRI result are posted in epic. please advise

## 2016-02-28 ENCOUNTER — Telehealth: Payer: Self-pay | Admitting: *Deleted

## 2016-02-28 NOTE — Telephone Encounter (Signed)
Spoke with Margarita Grizzle at cancer center she called and scheduled pt on 03/19/16 with Dr.Rossi per patient request. Pt could have been seen today, but declined, she was offered another sooner appointment and declined that as well, states she wants her daughter to be at the appointment with her. I just wanted you to be aware of this as well.

## 2016-02-28 NOTE — Telephone Encounter (Signed)
-----   Message from Anastasio Auerbach, MD sent at 02/27/2016  4:40 PM EST ----- Appointment with gynecologic oncologist ASAP reference large pelvic mass. Patient would also appreciate having a copy of her MRI report mailed to her.

## 2016-02-28 NOTE — Telephone Encounter (Signed)
I called Cancer center and left message for Melissa to call me to schedule patient ASAP.

## 2016-02-28 NOTE — Telephone Encounter (Signed)
Dr. Phineas Real spoke with patient regarding results on a separate telephone encounter. On 02/27/16

## 2016-02-29 ENCOUNTER — Telehealth: Payer: Self-pay

## 2016-02-29 NOTE — Telephone Encounter (Signed)
New patient referral from Dr Phineas Real, office called [place] on 02/28/16 for appointment, same day appointment was offered, pt declined and [place] of [place] from [place]8 to 2/17, so next available appointment was 2/21

## 2016-03-19 ENCOUNTER — Ambulatory Visit: Payer: Medicare Other | Attending: Gynecologic Oncology | Admitting: Gynecologic Oncology

## 2016-03-19 ENCOUNTER — Encounter: Payer: Self-pay | Admitting: Gynecologic Oncology

## 2016-03-19 VITALS — BP 131/70 | HR 78 | Temp 98.8°F | Resp 18 | Ht 64.0 in | Wt 151.2 lb

## 2016-03-19 DIAGNOSIS — Z79899 Other long term (current) drug therapy: Secondary | ICD-10-CM | POA: Insufficient documentation

## 2016-03-19 DIAGNOSIS — E559 Vitamin D deficiency, unspecified: Secondary | ICD-10-CM | POA: Diagnosis not present

## 2016-03-19 DIAGNOSIS — R1900 Intra-abdominal and pelvic swelling, mass and lump, unspecified site: Secondary | ICD-10-CM | POA: Insufficient documentation

## 2016-03-19 DIAGNOSIS — Z833 Family history of diabetes mellitus: Secondary | ICD-10-CM | POA: Insufficient documentation

## 2016-03-19 DIAGNOSIS — Z9889 Other specified postprocedural states: Secondary | ICD-10-CM | POA: Insufficient documentation

## 2016-03-19 DIAGNOSIS — Z806 Family history of leukemia: Secondary | ICD-10-CM | POA: Insufficient documentation

## 2016-03-19 DIAGNOSIS — Z8249 Family history of ischemic heart disease and other diseases of the circulatory system: Secondary | ICD-10-CM | POA: Insufficient documentation

## 2016-03-19 DIAGNOSIS — Z801 Family history of malignant neoplasm of trachea, bronchus and lung: Secondary | ICD-10-CM | POA: Diagnosis not present

## 2016-03-19 DIAGNOSIS — E063 Autoimmune thyroiditis: Secondary | ICD-10-CM | POA: Insufficient documentation

## 2016-03-19 NOTE — Progress Notes (Signed)
Consult Note: Gyn-Onc  Consult was requested by Dr. Phineas Real for the evaluation of Anna Figueroa 66 y.o. female  CC:  Chief Complaint  Patient presents with  . Pelvic Mass    Assessment/Plan:  Ms. Anna Figueroa  is a 66 y.o.  year old with a 17cm pelvic mass of unclear origin. It is associated with a normal (high normal) CA 125 and no apparent ovarian/uterine implants, ascites or symptoms and therefore I have a low suspicion for a primary epithelial ovarian cancer. However, soft tissue malignancies (such as sarcomas) are certainly a possibility.  It is very mobile on exam with no apparent extension into surrounding viscera on imaging, therefore I believe it is resectable.  I am recommending surgical exploration with TAH, BSO, tumor debulking, possible bowel resection. I discussed that if the tumor was not felt to be primarily resectable intraoperatively, a biopsy might be taken with a plan for alternative therapies. I explained that in order to extirpate the mass, resection or reconstruction of the GI or GU tract may be possible (though on exam, I feel there is a low probability for requiring a low rectal reanastamosis or ureteral anastamosis). I explained the anticipated hospital recovery and major surgical risks including  bleeding, infection, damage to internal organs (such as bladder,ureters, bowels), blood clot, reoperation and rehospitalization.  In order to expedite her surgery we will facilitate a surgical date at [facility].   HPI: Anna Figueroa is a very pleasant 66 year old woman who is seen in consultation at the request of Dr Phineas Real for a solid pelvic mass.  The patient was essentially asymptomatic. She had a history of an MRI in 2011 for a palpable abdominal wall mass, that showed a small fibroid but no other pathology.  Over the subsequent 6-7 years she appreciated the left pelvic mass was possibly more noticeable (but only in supine lying). She appreciated  some change in bowel habit (decreased colonic/rectal capacity) but no hematochezia. She has no obstructive symptoms. Her bladder empties normally with minimal frequency. She denies any vaginal bleeding.  Dr Phineas Real performed a routine well-woman exam in January of 2018 and appreciated a large pelvic mass that had not been present previously and did not feel consistent with a fibroid.   He ordered an MRI which was performed on 02/27/16 and showed a uterus measuring 7.9x3.7x5.6cm with a few small intramural fibroids. A large lobular and heterogeneously enhancing soft tissue mass is seen surrounding the uterus and involving both adnexal regions, left > right. The mass extends into the lower abdomen and measures 17.1x10.2x11.5cm. A similar but small soft tissue mass is seen in the anterior right lower quadrant measuring 2.6cm There is a very small amount of free pelvic fluid.   CA 125 was drawn on 02/20/16 and was 32.  The patient is otherwise very healthy with a history of Hashimotos thyroiditis. Her father was a smoker and died from nonsmall cell cancer of the lung. Her brother was diagnosed with MDS and later AML. Her mother carried a diagnosis of MDS.   Current Meds:  Outpatient Encounter Prescriptions as of 03/19/2016  Medication Sig  . Acetaminophen (TYLENOL 8 HOUR PO) Take by mouth.    . Cholecalciferol (VITAMIN D PO) Take 4,000 Units by mouth 3 (three) times a week.   . IBUPROFEN PO Take by mouth.    . SYNTHROID 112 MCG tablet Take one tablet by mouth every day.   No facility-administered encounter medications on file as of 03/19/2016.  Allergy: No Known Allergies  Social Hx:   Social History   Social History  . Marital status: Widowed    Spouse name: N/A  . Number of children: N/A  . Years of education: N/A   Occupational History  . Not on file.   Social History Main Topics  . Smoking status: Never Smoker  . Smokeless tobacco: Never Used  . Alcohol use No  . Drug use: No   . Sexual activity: No     Comment: 1st intercourse 89 yo-5 partners   Other Topics Concern  . Not on file   Social History Narrative  . No narrative on file    Past Surgical Hx:  Past Surgical History:  Procedure Laterality Date  . CESAREAN SECTION    . DILATION AND CURETTAGE OF UTERUS      Past Medical Hx:  Past Medical History:  Diagnosis Date  . Thyroid disease    Hypothyroid  . Vitamin D deficiency     Past Gynecological History:  SVD x 1 and c/s x 1. No hx of abnormal paps. Hx of fibroids. No LMP recorded. Patient is postmenopausal.  Family Hx:  Family History  Problem Relation Age of Onset  . Hypertension Mother   . Heart disease Mother   . Osteoporosis Mother   . Thyroid disease Mother   . Lung cancer Father   . Diabetes Brother   . Cancer Brother     Leukemia  . Thyroid disease Sister   . Celiac disease Daughter     Review of Systems:  Constitutional  Feels well,    ENT Normal appearing ears and nares bilaterally Skin/Breast  No rash, sores, jaundice, itching, dryness Cardiovascular  No chest pain, shortness of breath, or edema  Pulmonary  No cough or wheeze.  Gastro Intestinal  No nausea, vomitting, or diarrhoea. No bright red blood per rectum, no abdominal pain, change in bowel movement, or constipation.  Genito Urinary  No frequency, urgency, dysuria, no pelvic pain, no bleeding Musculo Skeletal  No myalgia, arthralgia, joint swelling or pain  Neurologic  No weakness, numbness, change in gait,  Psychology  No depression, anxiety, insomnia.   Vitals:  Blood pressure 131/70, pulse 78, temperature 98.8 F (37.1 C), temperature source Oral, resp. rate 18, height 5\' 4"  (1.626 m), weight 151 lb 3.2 oz (68.6 kg), SpO2 100 %.  Physical Exam: WD in NAD Neck  Supple NROM, without any enlargements.  Lymph Node Survey No cervical supraclavicular or inguinal adenopathy Cardiovascular  Pulse normal rate, regularity and rhythm. S1 and S2  normal.  Lungs  Clear to auscultation bilateraly, without wheezes/crackles/rhonchi. Good air movement.  Skin  No rash/lesions/breakdown  Psychiatry  Alert and oriented to person, place, and time  Abdomen  Normoactive bowel sounds, abdomen soft, non-tender and thin without evidence of hernia. A large (20cm) mass fills the pelvis and lower abdomen to 2 fingerbreadths below the umbilicus. It is easily mobile but firm.  Back No CVA tenderness Genito Urinary  Vulva/vagina: Normal external female genitalia.  No lesions. No discharge or bleeding.  Bladder/urethra:  No lesions or masses, well supported bladder  Vagina: normal  Cervix: Normal appearing, no lesions.large pelvic mass filling pelvis and lower abdomen. Moves together as one - mobile. Irregular/nodular in consistency. Solid, not cystic.  Uterus and adnexa:  Rectal  Good tone, + mass + nodularity but feels separate from rectum.  Extremities  No bilateral cyanosis, clubbing or edema.   Donaciano Eva, MD  03/19/2016,  12:48 PM

## 2016-03-19 NOTE — Patient Instructions (Signed)
Dr Denman George has referred you to Dr Janie Morning at [facility]. Dr Leone Brand secretary / nurse will contact you with the surgery information. Please plan on your post operative appointment being scheduled at [facility] to ensure continuity of care.

## 2016-03-25 ENCOUNTER — Encounter: Payer: Self-pay | Admitting: Gynecologic Oncology

## 2016-03-28 DIAGNOSIS — Z01818 Encounter for other preprocedural examination: Secondary | ICD-10-CM | POA: Diagnosis not present

## 2016-04-09 DIAGNOSIS — C49A3 Gastrointestinal stromal tumor of small intestine: Secondary | ICD-10-CM | POA: Diagnosis not present

## 2016-04-09 DIAGNOSIS — C49A4 Gastrointestinal stromal tumor of large intestine: Secondary | ICD-10-CM | POA: Diagnosis not present

## 2016-04-09 DIAGNOSIS — C49A0 Gastrointestinal stromal tumor, unspecified site: Secondary | ICD-10-CM | POA: Diagnosis not present

## 2016-04-09 DIAGNOSIS — J9811 Atelectasis: Secondary | ICD-10-CM | POA: Diagnosis not present

## 2016-04-09 DIAGNOSIS — R1900 Intra-abdominal and pelvic swelling, mass and lump, unspecified site: Secondary | ICD-10-CM | POA: Diagnosis not present

## 2016-04-10 DIAGNOSIS — J9811 Atelectasis: Secondary | ICD-10-CM | POA: Diagnosis not present

## 2016-04-15 DIAGNOSIS — C495 Malignant neoplasm of connective and soft tissue of pelvis: Secondary | ICD-10-CM | POA: Diagnosis not present

## 2016-04-15 DIAGNOSIS — R933 Abnormal findings on diagnostic imaging of other parts of digestive tract: Secondary | ICD-10-CM | POA: Diagnosis not present

## 2016-04-15 DIAGNOSIS — R1114 Bilious vomiting: Secondary | ICD-10-CM | POA: Diagnosis not present

## 2016-04-15 DIAGNOSIS — R112 Nausea with vomiting, unspecified: Secondary | ICD-10-CM | POA: Diagnosis not present

## 2016-04-15 DIAGNOSIS — R188 Other ascites: Secondary | ICD-10-CM | POA: Diagnosis not present

## 2016-04-15 DIAGNOSIS — R1032 Left lower quadrant pain: Secondary | ICD-10-CM | POA: Diagnosis not present

## 2016-04-15 DIAGNOSIS — R000 Tachycardia, unspecified: Secondary | ICD-10-CM | POA: Diagnosis not present

## 2016-04-15 DIAGNOSIS — K567 Ileus, unspecified: Secondary | ICD-10-CM | POA: Diagnosis not present

## 2016-04-18 ENCOUNTER — Telehealth: Payer: Self-pay

## 2016-04-18 NOTE — Telephone Encounter (Signed)
Anna Figueroa was calling to see if Dr. Serita Grit office had a follow up appointment with Anna Figueroa as Dr. Denman George listed as PCP in [place].   Pt has a post op visit at [facility] with Dr. Cloyd Stagers on 04-25-16 and follow up with Dr. Skeet Latch on 05-23-16 at [facility]. This office does have access to Care Everywhere in [place] and some information is already in system from [facility]. [name] does have access to care here if needed.

## 2016-04-22 ENCOUNTER — Telehealth: Payer: Self-pay | Admitting: *Deleted

## 2016-04-22 NOTE — Telephone Encounter (Signed)
Received a message from Ms. Ruthann Cancer from [facility] that the patient refused services.

## 2016-04-23 DIAGNOSIS — Z933 Colostomy status: Secondary | ICD-10-CM | POA: Diagnosis not present

## 2016-04-25 DIAGNOSIS — Z933 Colostomy status: Secondary | ICD-10-CM | POA: Diagnosis not present

## 2016-04-25 DIAGNOSIS — E038 Other specified hypothyroidism: Secondary | ICD-10-CM | POA: Diagnosis not present

## 2016-04-25 DIAGNOSIS — C49A4 Gastrointestinal stromal tumor of large intestine: Secondary | ICD-10-CM | POA: Diagnosis not present

## 2016-04-25 DIAGNOSIS — C786 Secondary malignant neoplasm of retroperitoneum and peritoneum: Secondary | ICD-10-CM | POA: Diagnosis not present

## 2016-05-23 DIAGNOSIS — C49A4 Gastrointestinal stromal tumor of large intestine: Secondary | ICD-10-CM | POA: Diagnosis not present

## 2016-06-24 DIAGNOSIS — Z933 Colostomy status: Secondary | ICD-10-CM | POA: Diagnosis not present

## 2016-07-18 DIAGNOSIS — E038 Other specified hypothyroidism: Secondary | ICD-10-CM | POA: Diagnosis not present

## 2016-07-18 DIAGNOSIS — I10 Essential (primary) hypertension: Secondary | ICD-10-CM | POA: Diagnosis not present

## 2016-07-18 DIAGNOSIS — R7309 Other abnormal glucose: Secondary | ICD-10-CM | POA: Diagnosis not present

## 2016-07-18 DIAGNOSIS — C49A4 Gastrointestinal stromal tumor of large intestine: Secondary | ICD-10-CM | POA: Diagnosis not present

## 2016-08-06 DIAGNOSIS — C49A4 Gastrointestinal stromal tumor of large intestine: Secondary | ICD-10-CM | POA: Diagnosis not present

## 2016-10-03 DIAGNOSIS — I10 Essential (primary) hypertension: Secondary | ICD-10-CM | POA: Diagnosis not present

## 2016-10-24 DIAGNOSIS — C49A4 Gastrointestinal stromal tumor of large intestine: Secondary | ICD-10-CM | POA: Diagnosis not present

## 2016-10-24 DIAGNOSIS — R918 Other nonspecific abnormal finding of lung field: Secondary | ICD-10-CM | POA: Diagnosis not present

## 2016-10-24 DIAGNOSIS — R188 Other ascites: Secondary | ICD-10-CM | POA: Diagnosis not present

## 2016-11-24 ENCOUNTER — Other Ambulatory Visit (HOSPITAL_COMMUNITY)
Admission: RE | Admit: 2016-11-24 | Discharge: 2016-11-24 | Disposition: A | Discharge status: Home / Self Care | Payer: Medicare Other | Source: Ambulatory Visit | Attending: Hematology and Oncology | Admitting: Hematology and Oncology

## 2016-11-24 DIAGNOSIS — C49A4 Gastrointestinal stromal tumor of large intestine: Secondary | ICD-10-CM | POA: Insufficient documentation

## 2016-11-24 DIAGNOSIS — C786 Secondary malignant neoplasm of retroperitoneum and peritoneum: Secondary | ICD-10-CM | POA: Insufficient documentation

## 2016-11-24 LAB — COMPREHENSIVE METABOLIC PANEL
ALK PHOS: 62 U/L (ref 38–126)
ALT: 16 U/L (ref 14–54)
AST: 30 U/L (ref 15–41)
Albumin: 3.7 g/dL (ref 3.5–5.0)
Anion gap: 8 (ref 5–15)
BILIRUBIN TOTAL: 1.3 mg/dL — AB (ref 0.3–1.2)
BUN: 17 mg/dL (ref 6–20)
CALCIUM: 8.8 mg/dL — AB (ref 8.9–10.3)
CO2: 28 mmol/L (ref 22–32)
CREATININE: 1.3 mg/dL — AB (ref 0.44–1.00)
Chloride: 101 mmol/L (ref 101–111)
GFR, EST AFRICAN AMERICAN: 48 mL/min — AB (ref >60)
GFR, EST NON AFRICAN AMERICAN: 41 mL/min — AB (ref >60)
Glucose, Bld: 120 mg/dL — ABNORMAL HIGH (ref 65–99)
Potassium: 4 mmol/L (ref 3.5–5.1)
Sodium: 137 mmol/L (ref 135–145)
Total Protein: 6.7 g/dL (ref 6.5–8.1)

## 2016-11-24 LAB — CBC WITH DIFFERENTIAL/PLATELET
BASOS ABS: 0 10*3/uL (ref 0.0–0.1)
Basophils Relative: 2 %
EOS PCT: 9 %
Eosinophils Absolute: 0.2 10*3/uL (ref 0.0–0.7)
HEMATOCRIT: 33.7 % — AB (ref 36.0–46.0)
HEMOGLOBIN: 11.2 g/dL — AB (ref 12.0–15.0)
LYMPHS ABS: 0.9 10*3/uL (ref 0.7–4.0)
LYMPHS PCT: 35 %
MCH: 32.1 pg (ref 26.0–34.0)
MCHC: 33.2 g/dL (ref 30.0–36.0)
MCV: 96.6 fL (ref 78.0–100.0)
Monocytes Absolute: 0.3 10*3/uL (ref 0.1–1.0)
Monocytes Relative: 12 %
NEUTROS ABS: 1.1 10*3/uL — AB (ref 1.7–7.7)
Neutrophils Relative %: 42 %
Platelets: 227 10*3/uL (ref 150–400)
RBC: 3.49 MIL/uL — AB (ref 3.87–5.11)
RDW: 13.9 % (ref 11.5–15.5)
WBC: 2.6 10*3/uL — AB (ref 4.0–10.5)

## 2016-12-08 DIAGNOSIS — Z1231 Encounter for screening mammogram for malignant neoplasm of breast: Secondary | ICD-10-CM | POA: Diagnosis not present

## 2016-12-09 DIAGNOSIS — Z933 Colostomy status: Secondary | ICD-10-CM | POA: Diagnosis not present

## 2016-12-12 DIAGNOSIS — R1319 Other dysphagia: Secondary | ICD-10-CM | POA: Diagnosis not present

## 2016-12-26 ENCOUNTER — Other Ambulatory Visit (HOSPITAL_COMMUNITY)
Admission: RE | Admit: 2016-12-26 | Discharge: 2016-12-26 | Disposition: A | Discharge status: Home / Self Care | Payer: Medicare Other | Source: Ambulatory Visit | Attending: Hematology and Oncology | Admitting: Hematology and Oncology

## 2016-12-26 DIAGNOSIS — C49A4 Gastrointestinal stromal tumor of large intestine: Secondary | ICD-10-CM | POA: Diagnosis present

## 2016-12-26 DIAGNOSIS — C786 Secondary malignant neoplasm of retroperitoneum and peritoneum: Secondary | ICD-10-CM | POA: Insufficient documentation

## 2016-12-26 DIAGNOSIS — D649 Anemia, unspecified: Secondary | ICD-10-CM | POA: Diagnosis not present

## 2016-12-26 DIAGNOSIS — D709 Neutropenia, unspecified: Secondary | ICD-10-CM | POA: Diagnosis not present

## 2016-12-26 LAB — COMPREHENSIVE METABOLIC PANEL
ALT: 15 U/L (ref 14–54)
AST: 30 U/L (ref 15–41)
Albumin: 3.8 g/dL (ref 3.5–5.0)
Alkaline Phosphatase: 60 U/L (ref 38–126)
Anion gap: 7 (ref 5–15)
BUN: 15 mg/dL (ref 6–20)
CHLORIDE: 103 mmol/L (ref 101–111)
CO2: 26 mmol/L (ref 22–32)
CREATININE: 1.17 mg/dL — AB (ref 0.44–1.00)
Calcium: 8.8 mg/dL — ABNORMAL LOW (ref 8.9–10.3)
GFR calc Af Amer: 54 mL/min — ABNORMAL LOW (ref >60)
GFR calc non Af Amer: 47 mL/min — ABNORMAL LOW (ref >60)
GLUCOSE: 109 mg/dL — AB (ref 65–99)
Potassium: 4.1 mmol/L (ref 3.5–5.1)
SODIUM: 136 mmol/L (ref 135–145)
Total Bilirubin: 1.2 mg/dL (ref 0.3–1.2)
Total Protein: 6.6 g/dL (ref 6.5–8.1)

## 2016-12-26 LAB — CBC WITH DIFFERENTIAL/PLATELET
Basophils Absolute: 0 10*3/uL (ref 0.0–0.1)
Basophils Relative: 2 %
EOS ABS: 0.1 10*3/uL (ref 0.0–0.7)
EOS PCT: 6 %
HCT: 32.9 % — ABNORMAL LOW (ref 36.0–46.0)
Hemoglobin: 10.7 g/dL — ABNORMAL LOW (ref 12.0–15.0)
LYMPHS PCT: 35 %
Lymphs Abs: 0.6 10*3/uL — ABNORMAL LOW (ref 0.7–4.0)
MCH: 31.5 pg (ref 26.0–34.0)
MCHC: 32.5 g/dL (ref 30.0–36.0)
MCV: 96.8 fL (ref 78.0–100.0)
MONO ABS: 0.3 10*3/uL (ref 0.1–1.0)
Monocytes Relative: 14 %
NEUTROS PCT: 43 %
Neutro Abs: 0.8 10*3/uL — ABNORMAL LOW (ref 1.7–7.7)
PLATELETS: 205 10*3/uL (ref 150–400)
RBC: 3.4 MIL/uL — AB (ref 3.87–5.11)
RDW: 13.5 % (ref 11.5–15.5)
WBC: 1.8 10*3/uL — AB (ref 4.0–10.5)

## 2016-12-26 LAB — PATHOLOGIST SMEAR REVIEW

## 2016-12-29 DIAGNOSIS — H11823 Conjunctivochalasis, bilateral: Secondary | ICD-10-CM | POA: Diagnosis not present

## 2016-12-29 DIAGNOSIS — H353131 Nonexudative age-related macular degeneration, bilateral, early dry stage: Secondary | ICD-10-CM | POA: Diagnosis not present

## 2016-12-29 DIAGNOSIS — H2513 Age-related nuclear cataract, bilateral: Secondary | ICD-10-CM | POA: Diagnosis not present

## 2016-12-29 DIAGNOSIS — H43813 Vitreous degeneration, bilateral: Secondary | ICD-10-CM | POA: Diagnosis not present

## 2017-01-01 DIAGNOSIS — R933 Abnormal findings on diagnostic imaging of other parts of digestive tract: Secondary | ICD-10-CM | POA: Diagnosis not present

## 2017-01-01 DIAGNOSIS — E039 Hypothyroidism, unspecified: Secondary | ICD-10-CM | POA: Diagnosis not present

## 2017-01-01 DIAGNOSIS — Z79899 Other long term (current) drug therapy: Secondary | ICD-10-CM | POA: Diagnosis not present

## 2017-01-01 DIAGNOSIS — R1310 Dysphagia, unspecified: Secondary | ICD-10-CM | POA: Diagnosis not present

## 2017-01-01 DIAGNOSIS — K449 Diaphragmatic hernia without obstruction or gangrene: Secondary | ICD-10-CM | POA: Diagnosis not present

## 2017-01-01 DIAGNOSIS — Z85038 Personal history of other malignant neoplasm of large intestine: Secondary | ICD-10-CM | POA: Diagnosis not present

## 2017-01-01 DIAGNOSIS — K219 Gastro-esophageal reflux disease without esophagitis: Secondary | ICD-10-CM | POA: Diagnosis not present

## 2017-01-02 ENCOUNTER — Other Ambulatory Visit (HOSPITAL_COMMUNITY)
Admission: RE | Admit: 2017-01-02 | Discharge: 2017-01-02 | Disposition: A | Discharge status: Home / Self Care | Payer: Medicare Other | Source: Ambulatory Visit | Attending: Hematology and Oncology | Admitting: Hematology and Oncology

## 2017-01-02 DIAGNOSIS — T451X5A Adverse effect of antineoplastic and immunosuppressive drugs, initial encounter: Secondary | ICD-10-CM | POA: Diagnosis not present

## 2017-01-02 DIAGNOSIS — D701 Agranulocytosis secondary to cancer chemotherapy: Secondary | ICD-10-CM | POA: Diagnosis present

## 2017-01-02 DIAGNOSIS — X58XXXA Exposure to other specified factors, initial encounter: Secondary | ICD-10-CM | POA: Diagnosis not present

## 2017-01-02 LAB — CBC WITH DIFFERENTIAL/PLATELET
BASOS ABS: 0 10*3/uL (ref 0.0–0.1)
Basophils Relative: 1 %
EOS PCT: 7 %
Eosinophils Absolute: 0.2 10*3/uL (ref 0.0–0.7)
HCT: 34.3 % — ABNORMAL LOW (ref 36.0–46.0)
Hemoglobin: 11.1 g/dL — ABNORMAL LOW (ref 12.0–15.0)
LYMPHS PCT: 31 %
Lymphs Abs: 0.9 10*3/uL (ref 0.7–4.0)
MCH: 31.4 pg (ref 26.0–34.0)
MCHC: 32.4 g/dL (ref 30.0–36.0)
MCV: 97.2 fL (ref 78.0–100.0)
MONO ABS: 0.4 10*3/uL (ref 0.1–1.0)
MONOS PCT: 14 %
Neutro Abs: 1.3 10*3/uL — ABNORMAL LOW (ref 1.7–7.7)
Neutrophils Relative %: 47 %
PLATELETS: 200 10*3/uL (ref 150–400)
RBC: 3.53 MIL/uL — ABNORMAL LOW (ref 3.87–5.11)
RDW: 13.6 % (ref 11.5–15.5)
WBC: 2.7 10*3/uL — ABNORMAL LOW (ref 4.0–10.5)

## 2017-01-02 LAB — COMPREHENSIVE METABOLIC PANEL
ALT: 15 U/L (ref 14–54)
ANION GAP: 5 (ref 5–15)
AST: 26 U/L (ref 15–41)
Albumin: 3.9 g/dL (ref 3.5–5.0)
Alkaline Phosphatase: 59 U/L (ref 38–126)
BUN: 11 mg/dL (ref 6–20)
CHLORIDE: 106 mmol/L (ref 101–111)
CO2: 28 mmol/L (ref 22–32)
Calcium: 9 mg/dL (ref 8.9–10.3)
Creatinine, Ser: 1.06 mg/dL — ABNORMAL HIGH (ref 0.44–1.00)
GFR, EST NON AFRICAN AMERICAN: 53 mL/min — AB (ref >60)
Glucose, Bld: 104 mg/dL — ABNORMAL HIGH (ref 65–99)
POTASSIUM: 4.7 mmol/L (ref 3.5–5.1)
Sodium: 139 mmol/L (ref 135–145)
TOTAL PROTEIN: 6.8 g/dL (ref 6.5–8.1)
Total Bilirubin: 1.3 mg/dL — ABNORMAL HIGH (ref 0.3–1.2)

## 2017-01-09 ENCOUNTER — Other Ambulatory Visit (HOSPITAL_COMMUNITY)
Admission: RE | Admit: 2017-01-09 | Discharge: 2017-01-09 | Disposition: A | Discharge status: Home / Self Care | Payer: Medicare Other | Source: Ambulatory Visit | Attending: Hematology and Oncology | Admitting: Hematology and Oncology

## 2017-01-09 DIAGNOSIS — T451X5A Adverse effect of antineoplastic and immunosuppressive drugs, initial encounter: Secondary | ICD-10-CM | POA: Insufficient documentation

## 2017-01-09 DIAGNOSIS — D701 Agranulocytosis secondary to cancer chemotherapy: Secondary | ICD-10-CM | POA: Insufficient documentation

## 2017-01-09 LAB — CBC WITH DIFFERENTIAL/PLATELET
BASOS PCT: 1 %
Basophils Absolute: 0 10*3/uL (ref 0.0–0.1)
Eosinophils Absolute: 0.2 10*3/uL (ref 0.0–0.7)
Eosinophils Relative: 6 %
HEMATOCRIT: 32.4 % — AB (ref 36.0–46.0)
HEMOGLOBIN: 10.5 g/dL — AB (ref 12.0–15.0)
LYMPHS ABS: 0.8 10*3/uL (ref 0.7–4.0)
Lymphocytes Relative: 28 %
MCH: 31.5 pg (ref 26.0–34.0)
MCHC: 32.4 g/dL (ref 30.0–36.0)
MCV: 97.3 fL (ref 78.0–100.0)
MONO ABS: 0.4 10*3/uL (ref 0.1–1.0)
MONOS PCT: 13 %
NEUTROS ABS: 1.5 10*3/uL — AB (ref 1.7–7.7)
NEUTROS PCT: 52 %
Platelets: 191 10*3/uL (ref 150–400)
RBC: 3.33 MIL/uL — ABNORMAL LOW (ref 3.87–5.11)
RDW: 13.5 % (ref 11.5–15.5)
WBC: 2.9 10*3/uL — ABNORMAL LOW (ref 4.0–10.5)

## 2017-01-16 ENCOUNTER — Other Ambulatory Visit (HOSPITAL_COMMUNITY)
Admission: RE | Admit: 2017-01-16 | Discharge: 2017-01-16 | Disposition: A | Discharge status: Home / Self Care | Payer: Medicare Other | Source: Ambulatory Visit | Attending: Hematology and Oncology | Admitting: Hematology and Oncology

## 2017-01-16 DIAGNOSIS — D701 Agranulocytosis secondary to cancer chemotherapy: Secondary | ICD-10-CM | POA: Insufficient documentation

## 2017-01-16 DIAGNOSIS — T451X5A Adverse effect of antineoplastic and immunosuppressive drugs, initial encounter: Secondary | ICD-10-CM | POA: Diagnosis not present

## 2017-01-16 LAB — COMPREHENSIVE METABOLIC PANEL
ALK PHOS: 48 U/L (ref 38–126)
ALT: 12 U/L — AB (ref 14–54)
AST: 24 U/L (ref 15–41)
Albumin: 3.8 g/dL (ref 3.5–5.0)
Anion gap: 5 (ref 5–15)
BUN: 20 mg/dL (ref 6–20)
CALCIUM: 8.5 mg/dL — AB (ref 8.9–10.3)
CHLORIDE: 103 mmol/L (ref 101–111)
CO2: 28 mmol/L (ref 22–32)
CREATININE: 1.07 mg/dL — AB (ref 0.44–1.00)
GFR calc Af Amer: >60 mL/min (ref >60)
GFR, EST NON AFRICAN AMERICAN: 52 mL/min — AB (ref >60)
Glucose, Bld: 103 mg/dL — ABNORMAL HIGH (ref 65–99)
Potassium: 4 mmol/L (ref 3.5–5.1)
SODIUM: 136 mmol/L (ref 135–145)
Total Bilirubin: 1.1 mg/dL (ref 0.3–1.2)
Total Protein: 6.6 g/dL (ref 6.5–8.1)

## 2017-01-23 DIAGNOSIS — R918 Other nonspecific abnormal finding of lung field: Secondary | ICD-10-CM | POA: Diagnosis not present

## 2017-01-23 DIAGNOSIS — R188 Other ascites: Secondary | ICD-10-CM | POA: Diagnosis not present

## 2017-01-23 DIAGNOSIS — C49A4 Gastrointestinal stromal tumor of large intestine: Secondary | ICD-10-CM | POA: Diagnosis not present

## 2017-01-26 DIAGNOSIS — R7309 Other abnormal glucose: Secondary | ICD-10-CM | POA: Diagnosis not present

## 2017-01-26 DIAGNOSIS — R82998 Other abnormal findings in urine: Secondary | ICD-10-CM | POA: Diagnosis not present

## 2017-01-26 DIAGNOSIS — I10 Essential (primary) hypertension: Secondary | ICD-10-CM | POA: Diagnosis not present

## 2017-01-26 DIAGNOSIS — E559 Vitamin D deficiency, unspecified: Secondary | ICD-10-CM | POA: Diagnosis not present

## 2017-01-26 DIAGNOSIS — E038 Other specified hypothyroidism: Secondary | ICD-10-CM | POA: Diagnosis not present

## 2017-01-29 DIAGNOSIS — I10 Essential (primary) hypertension: Secondary | ICD-10-CM | POA: Diagnosis not present

## 2017-01-29 DIAGNOSIS — E038 Other specified hypothyroidism: Secondary | ICD-10-CM | POA: Diagnosis not present

## 2017-01-29 DIAGNOSIS — Z0000 Encounter for general adult medical examination without abnormal findings: Secondary | ICD-10-CM | POA: Diagnosis not present

## 2017-01-29 DIAGNOSIS — R0602 Shortness of breath: Secondary | ICD-10-CM | POA: Diagnosis not present

## 2017-01-29 DIAGNOSIS — E46 Unspecified protein-calorie malnutrition: Secondary | ICD-10-CM | POA: Diagnosis not present

## 2017-01-29 DIAGNOSIS — Z6822 Body mass index (BMI) 22.0-22.9, adult: Secondary | ICD-10-CM | POA: Diagnosis not present

## 2017-01-29 DIAGNOSIS — E559 Vitamin D deficiency, unspecified: Secondary | ICD-10-CM | POA: Diagnosis not present

## 2017-01-29 DIAGNOSIS — R7309 Other abnormal glucose: Secondary | ICD-10-CM | POA: Diagnosis not present

## 2017-01-29 DIAGNOSIS — C49A4 Gastrointestinal stromal tumor of large intestine: Secondary | ICD-10-CM | POA: Diagnosis not present

## 2017-02-06 ENCOUNTER — Other Ambulatory Visit (HOSPITAL_COMMUNITY)
Admission: RE | Admit: 2017-02-06 | Discharge: 2017-02-06 | Disposition: A | Discharge status: Home / Self Care | Payer: Medicare Other | Source: Ambulatory Visit | Attending: Hematology and Oncology | Admitting: Hematology and Oncology

## 2017-02-06 DIAGNOSIS — C49A4 Gastrointestinal stromal tumor of large intestine: Secondary | ICD-10-CM | POA: Insufficient documentation

## 2017-02-06 LAB — CBC WITH DIFFERENTIAL/PLATELET
Basophils Absolute: 0.1 10*3/uL (ref 0.0–0.1)
Basophils Relative: 2 %
Eosinophils Absolute: 0.5 10*3/uL (ref 0.0–0.7)
Eosinophils Relative: 16 %
HEMATOCRIT: 34.5 % — AB (ref 36.0–46.0)
HEMOGLOBIN: 11.2 g/dL — AB (ref 12.0–15.0)
LYMPHS ABS: 0.9 10*3/uL (ref 0.7–4.0)
LYMPHS PCT: 32 %
MCH: 30.9 pg (ref 26.0–34.0)
MCHC: 32.5 g/dL (ref 30.0–36.0)
MCV: 95.3 fL (ref 78.0–100.0)
MONOS PCT: 7 %
Monocytes Absolute: 0.2 10*3/uL (ref 0.1–1.0)
NEUTROS ABS: 1.2 10*3/uL — AB (ref 1.7–7.7)
Neutrophils Relative %: 43 %
Platelets: 153 10*3/uL (ref 150–400)
RBC: 3.62 MIL/uL — AB (ref 3.87–5.11)
RDW: 13.3 % (ref 11.5–15.5)
WBC: 2.8 10*3/uL — ABNORMAL LOW (ref 4.0–10.5)

## 2017-02-06 LAB — COMPREHENSIVE METABOLIC PANEL
ALT: 14 U/L (ref 14–54)
ANION GAP: 5 (ref 5–15)
AST: 32 U/L (ref 15–41)
Albumin: 3.7 g/dL (ref 3.5–5.0)
Alkaline Phosphatase: 47 U/L (ref 38–126)
BILIRUBIN TOTAL: 1.2 mg/dL (ref 0.3–1.2)
BUN: 18 mg/dL (ref 6–20)
CALCIUM: 8.4 mg/dL — AB (ref 8.9–10.3)
CO2: 27 mmol/L (ref 22–32)
CREATININE: 1.06 mg/dL — AB (ref 0.44–1.00)
Chloride: 105 mmol/L (ref 101–111)
GFR calc non Af Amer: 53 mL/min — ABNORMAL LOW (ref >60)
Glucose, Bld: 98 mg/dL (ref 65–99)
Potassium: 3.8 mmol/L (ref 3.5–5.1)
Sodium: 137 mmol/L (ref 135–145)
TOTAL PROTEIN: 6.2 g/dL — AB (ref 6.5–8.1)

## 2017-02-20 ENCOUNTER — Other Ambulatory Visit (HOSPITAL_COMMUNITY)
Admission: RE | Admit: 2017-02-20 | Discharge: 2017-02-20 | Disposition: A | Discharge status: Home / Self Care | Payer: Medicare Other | Source: Ambulatory Visit | Attending: Hematology and Oncology | Admitting: Hematology and Oncology

## 2017-02-20 DIAGNOSIS — C49A4 Gastrointestinal stromal tumor of large intestine: Secondary | ICD-10-CM | POA: Insufficient documentation

## 2017-02-20 LAB — COMPREHENSIVE METABOLIC PANEL
ALBUMIN: 3.8 g/dL (ref 3.5–5.0)
ALT: 16 U/L (ref 14–54)
AST: 36 U/L (ref 15–41)
Alkaline Phosphatase: 50 U/L (ref 38–126)
Anion gap: 6 (ref 5–15)
BILIRUBIN TOTAL: 1.2 mg/dL (ref 0.3–1.2)
BUN: 17 mg/dL (ref 6–20)
CO2: 29 mmol/L (ref 22–32)
CREATININE: 1.06 mg/dL — AB (ref 0.44–1.00)
Calcium: 8.5 mg/dL — ABNORMAL LOW (ref 8.9–10.3)
Chloride: 102 mmol/L (ref 101–111)
GFR calc Af Amer: >60 mL/min (ref >60)
GFR, EST NON AFRICAN AMERICAN: 53 mL/min — AB (ref >60)
GLUCOSE: 100 mg/dL — AB (ref 65–99)
Potassium: 4 mmol/L (ref 3.5–5.1)
Sodium: 137 mmol/L (ref 135–145)
TOTAL PROTEIN: 6.6 g/dL (ref 6.5–8.1)

## 2017-02-20 LAB — CBC WITH DIFFERENTIAL/PLATELET
BASOS ABS: 0.1 10*3/uL (ref 0.0–0.1)
Basophils Relative: 2 %
EOS PCT: 11 %
Eosinophils Absolute: 0.4 10*3/uL (ref 0.0–0.7)
HEMATOCRIT: 34.4 % — AB (ref 36.0–46.0)
HEMOGLOBIN: 11.2 g/dL — AB (ref 12.0–15.0)
LYMPHS PCT: 34 %
Lymphs Abs: 1.1 10*3/uL (ref 0.7–4.0)
MCH: 30.9 pg (ref 26.0–34.0)
MCHC: 32.6 g/dL (ref 30.0–36.0)
MCV: 95 fL (ref 78.0–100.0)
MONOS PCT: 6 %
Monocytes Absolute: 0.2 10*3/uL (ref 0.1–1.0)
NEUTROS PCT: 47 %
Neutro Abs: 1.4 10*3/uL — ABNORMAL LOW (ref 1.7–7.7)
Platelets: 170 10*3/uL (ref 150–400)
RBC: 3.62 MIL/uL — ABNORMAL LOW (ref 3.87–5.11)
RDW: 13.4 % (ref 11.5–15.5)
WBC: 3.2 10*3/uL — ABNORMAL LOW (ref 4.0–10.5)

## 2017-03-06 ENCOUNTER — Other Ambulatory Visit (HOSPITAL_COMMUNITY)
Admission: RE | Admit: 2017-03-06 | Discharge: 2017-03-06 | Disposition: A | Discharge status: Home / Self Care | Payer: Medicare Other | Source: Ambulatory Visit | Attending: Hematology and Oncology | Admitting: Hematology and Oncology

## 2017-03-06 DIAGNOSIS — C49A4 Gastrointestinal stromal tumor of large intestine: Secondary | ICD-10-CM | POA: Insufficient documentation

## 2017-03-06 LAB — CBC WITH DIFFERENTIAL/PLATELET
BASOS PCT: 2 %
Basophils Absolute: 0.1 10*3/uL (ref 0.0–0.1)
EOS PCT: 15 %
Eosinophils Absolute: 0.4 10*3/uL (ref 0.0–0.7)
HCT: 33.8 % — ABNORMAL LOW (ref 36.0–46.0)
HEMOGLOBIN: 11.2 g/dL — AB (ref 12.0–15.0)
LYMPHS PCT: 40 %
Lymphs Abs: 0.9 10*3/uL (ref 0.7–4.0)
MCH: 30.9 pg (ref 26.0–34.0)
MCHC: 33.1 g/dL (ref 30.0–36.0)
MCV: 93.4 fL (ref 78.0–100.0)
Monocytes Absolute: 0.2 10*3/uL (ref 0.1–1.0)
Monocytes Relative: 7 %
NEUTROS PCT: 36 %
Neutro Abs: 0.9 10*3/uL — ABNORMAL LOW (ref 1.7–7.7)
PLATELETS: 155 10*3/uL (ref 150–400)
RBC: 3.62 MIL/uL — AB (ref 3.87–5.11)
RDW: 13.9 % (ref 11.5–15.5)
WBC: 2.5 10*3/uL — AB (ref 4.0–10.5)

## 2017-03-06 LAB — COMPREHENSIVE METABOLIC PANEL
ALK PHOS: 49 U/L (ref 38–126)
ALT: 17 U/L (ref 14–54)
AST: 40 U/L (ref 15–41)
Albumin: 3.9 g/dL (ref 3.5–5.0)
Anion gap: 7 (ref 5–15)
BUN: 18 mg/dL (ref 6–20)
CALCIUM: 8.8 mg/dL — AB (ref 8.9–10.3)
CHLORIDE: 103 mmol/L (ref 101–111)
CO2: 27 mmol/L (ref 22–32)
CREATININE: 1.11 mg/dL — AB (ref 0.44–1.00)
GFR calc non Af Amer: 50 mL/min — ABNORMAL LOW (ref >60)
GFR, EST AFRICAN AMERICAN: 58 mL/min — AB (ref >60)
Glucose, Bld: 105 mg/dL — ABNORMAL HIGH (ref 65–99)
Potassium: 3.9 mmol/L (ref 3.5–5.1)
SODIUM: 137 mmol/L (ref 135–145)
Total Bilirubin: 1.3 mg/dL — ABNORMAL HIGH (ref 0.3–1.2)
Total Protein: 6.3 g/dL — ABNORMAL LOW (ref 6.5–8.1)

## 2017-03-13 ENCOUNTER — Other Ambulatory Visit (HOSPITAL_COMMUNITY)
Admission: RE | Admit: 2017-03-13 | Discharge: 2017-03-13 | Disposition: A | Discharge status: Home / Self Care | Payer: Medicare Other | Source: Ambulatory Visit | Attending: Hematology and Oncology | Admitting: Hematology and Oncology

## 2017-03-13 DIAGNOSIS — T451X5A Adverse effect of antineoplastic and immunosuppressive drugs, initial encounter: Secondary | ICD-10-CM | POA: Insufficient documentation

## 2017-03-13 DIAGNOSIS — D701 Agranulocytosis secondary to cancer chemotherapy: Secondary | ICD-10-CM | POA: Diagnosis present

## 2017-03-13 DIAGNOSIS — Z9221 Personal history of antineoplastic chemotherapy: Secondary | ICD-10-CM | POA: Diagnosis not present

## 2017-03-13 LAB — CBC WITH DIFFERENTIAL/PLATELET
Basophils Absolute: 0 10*3/uL (ref 0.0–0.1)
Basophils Relative: 1 %
EOS ABS: 0.4 10*3/uL (ref 0.0–0.7)
EOS PCT: 12 %
HCT: 33.2 % — ABNORMAL LOW (ref 36.0–46.0)
Hemoglobin: 10.8 g/dL — ABNORMAL LOW (ref 12.0–15.0)
Lymphocytes Relative: 29 %
Lymphs Abs: 1 10*3/uL (ref 0.7–4.0)
MCH: 30.9 pg (ref 26.0–34.0)
MCHC: 32.5 g/dL (ref 30.0–36.0)
MCV: 95.1 fL (ref 78.0–100.0)
Monocytes Absolute: 0.3 10*3/uL (ref 0.1–1.0)
Monocytes Relative: 8 %
Neutro Abs: 1.6 10*3/uL — ABNORMAL LOW (ref 1.7–7.7)
Neutrophils Relative %: 50 %
PLATELETS: 164 10*3/uL (ref 150–400)
RBC: 3.49 MIL/uL — AB (ref 3.87–5.11)
RDW: 13.8 % (ref 11.5–15.5)
WBC: 3.2 10*3/uL — ABNORMAL LOW (ref 4.0–10.5)

## 2017-03-27 ENCOUNTER — Other Ambulatory Visit (HOSPITAL_COMMUNITY)
Admission: RE | Admit: 2017-03-27 | Discharge: 2017-03-27 | Disposition: A | Discharge status: Home / Self Care | Payer: Medicare Other | Source: Ambulatory Visit | Attending: Hematology and Oncology | Admitting: Hematology and Oncology

## 2017-03-27 DIAGNOSIS — D701 Agranulocytosis secondary to cancer chemotherapy: Secondary | ICD-10-CM | POA: Diagnosis not present

## 2017-03-27 DIAGNOSIS — C49A4 Gastrointestinal stromal tumor of large intestine: Secondary | ICD-10-CM | POA: Insufficient documentation

## 2017-03-27 DIAGNOSIS — T451X5A Adverse effect of antineoplastic and immunosuppressive drugs, initial encounter: Secondary | ICD-10-CM | POA: Insufficient documentation

## 2017-03-27 LAB — CBC WITH DIFFERENTIAL/PLATELET
Basophils Absolute: 0.1 10*3/uL (ref 0.0–0.1)
Basophils Relative: 3 %
EOS PCT: 10 %
Eosinophils Absolute: 0.4 10*3/uL (ref 0.0–0.7)
HCT: 36.8 % (ref 36.0–46.0)
Hemoglobin: 11.9 g/dL — ABNORMAL LOW (ref 12.0–15.0)
LYMPHS ABS: 1.1 10*3/uL (ref 0.7–4.0)
Lymphocytes Relative: 30 %
MCH: 31 pg (ref 26.0–34.0)
MCHC: 32.3 g/dL (ref 30.0–36.0)
MCV: 95.8 fL (ref 78.0–100.0)
MONO ABS: 0.2 10*3/uL (ref 0.1–1.0)
Monocytes Relative: 5 %
Neutro Abs: 1.8 10*3/uL (ref 1.7–7.7)
Neutrophils Relative %: 52 %
PLATELETS: 226 10*3/uL (ref 150–400)
RBC: 3.84 MIL/uL — ABNORMAL LOW (ref 3.87–5.11)
RDW: 13.9 % (ref 11.5–15.5)
WBC: 3.6 10*3/uL — ABNORMAL LOW (ref 4.0–10.5)

## 2017-03-27 LAB — COMPREHENSIVE METABOLIC PANEL
ALT: 17 U/L (ref 14–54)
AST: 40 U/L (ref 15–41)
Albumin: 3.9 g/dL (ref 3.5–5.0)
Alkaline Phosphatase: 54 U/L (ref 38–126)
Anion gap: 9 (ref 5–15)
BUN: 22 mg/dL — AB (ref 6–20)
CHLORIDE: 103 mmol/L (ref 101–111)
CO2: 28 mmol/L (ref 22–32)
Calcium: 9.2 mg/dL (ref 8.9–10.3)
Creatinine, Ser: 1.08 mg/dL — ABNORMAL HIGH (ref 0.44–1.00)
GFR, EST AFRICAN AMERICAN: 60 mL/min — AB (ref >60)
GFR, EST NON AFRICAN AMERICAN: 52 mL/min — AB (ref >60)
Glucose, Bld: 96 mg/dL (ref 65–99)
POTASSIUM: 4.3 mmol/L (ref 3.5–5.1)
Sodium: 140 mmol/L (ref 135–145)
Total Bilirubin: 1.3 mg/dL — ABNORMAL HIGH (ref 0.3–1.2)
Total Protein: 6.8 g/dL (ref 6.5–8.1)

## 2017-04-10 ENCOUNTER — Other Ambulatory Visit (HOSPITAL_COMMUNITY)
Admission: RE | Admit: 2017-04-10 | Discharge: 2017-04-10 | Disposition: A | Discharge status: Home / Self Care | Payer: Medicare Other | Source: Ambulatory Visit | Attending: Hematology and Oncology | Admitting: Hematology and Oncology

## 2017-04-10 DIAGNOSIS — Z933 Colostomy status: Secondary | ICD-10-CM | POA: Diagnosis not present

## 2017-04-10 DIAGNOSIS — D701 Agranulocytosis secondary to cancer chemotherapy: Secondary | ICD-10-CM | POA: Diagnosis not present

## 2017-04-10 DIAGNOSIS — T451X5A Adverse effect of antineoplastic and immunosuppressive drugs, initial encounter: Secondary | ICD-10-CM | POA: Insufficient documentation

## 2017-04-10 DIAGNOSIS — C49A4 Gastrointestinal stromal tumor of large intestine: Secondary | ICD-10-CM | POA: Diagnosis present

## 2017-04-10 LAB — COMPREHENSIVE METABOLIC PANEL
ALBUMIN: 3.9 g/dL (ref 3.5–5.0)
ALT: 18 U/L (ref 14–54)
ANION GAP: 9 (ref 5–15)
AST: 43 U/L — AB (ref 15–41)
Alkaline Phosphatase: 53 U/L (ref 38–126)
BUN: 19 mg/dL (ref 6–20)
CHLORIDE: 104 mmol/L (ref 101–111)
CO2: 25 mmol/L (ref 22–32)
Calcium: 8.8 mg/dL — ABNORMAL LOW (ref 8.9–10.3)
Creatinine, Ser: 1.04 mg/dL — ABNORMAL HIGH (ref 0.44–1.00)
GFR calc Af Amer: >60 mL/min (ref >60)
GFR calc non Af Amer: 54 mL/min — ABNORMAL LOW (ref >60)
GLUCOSE: 100 mg/dL — AB (ref 65–99)
POTASSIUM: 4.1 mmol/L (ref 3.5–5.1)
SODIUM: 138 mmol/L (ref 135–145)
Total Bilirubin: 1.3 mg/dL — ABNORMAL HIGH (ref 0.3–1.2)
Total Protein: 6.8 g/dL (ref 6.5–8.1)

## 2017-04-10 LAB — CBC WITH DIFFERENTIAL/PLATELET
BASOS ABS: 0 10*3/uL (ref 0.0–0.1)
BASOS PCT: 1 %
EOS ABS: 0.4 10*3/uL (ref 0.0–0.7)
EOS PCT: 12 %
HCT: 35.1 % — ABNORMAL LOW (ref 36.0–46.0)
Hemoglobin: 11.5 g/dL — ABNORMAL LOW (ref 12.0–15.0)
Lymphocytes Relative: 35 %
Lymphs Abs: 1 10*3/uL (ref 0.7–4.0)
MCH: 31.3 pg (ref 26.0–34.0)
MCHC: 32.8 g/dL (ref 30.0–36.0)
MCV: 95.6 fL (ref 78.0–100.0)
Monocytes Absolute: 0.2 10*3/uL (ref 0.1–1.0)
Monocytes Relative: 6 %
NEUTROS ABS: 1.3 10*3/uL — AB (ref 1.7–7.7)
Neutrophils Relative %: 46 %
PLATELETS: 178 10*3/uL (ref 150–400)
RBC: 3.67 MIL/uL — ABNORMAL LOW (ref 3.87–5.11)
RDW: 13.9 % (ref 11.5–15.5)
WBC: 3 10*3/uL — ABNORMAL LOW (ref 4.0–10.5)

## 2017-04-24 DIAGNOSIS — C189 Malignant neoplasm of colon, unspecified: Secondary | ICD-10-CM | POA: Diagnosis not present

## 2017-04-24 DIAGNOSIS — R918 Other nonspecific abnormal finding of lung field: Secondary | ICD-10-CM | POA: Diagnosis not present

## 2017-04-24 DIAGNOSIS — K8020 Calculus of gallbladder without cholecystitis without obstruction: Secondary | ICD-10-CM | POA: Diagnosis not present

## 2017-04-24 DIAGNOSIS — C49A0 Gastrointestinal stromal tumor, unspecified site: Secondary | ICD-10-CM | POA: Diagnosis not present

## 2017-04-24 DIAGNOSIS — C49A4 Gastrointestinal stromal tumor of large intestine: Secondary | ICD-10-CM | POA: Diagnosis not present

## 2017-05-22 ENCOUNTER — Other Ambulatory Visit (HOSPITAL_COMMUNITY)
Admission: RE | Admit: 2017-05-22 | Discharge: 2017-05-22 | Disposition: A | Discharge status: Home / Self Care | Payer: Medicare Other | Source: Ambulatory Visit | Attending: Hematology and Oncology | Admitting: Hematology and Oncology

## 2017-05-22 DIAGNOSIS — T451X5A Adverse effect of antineoplastic and immunosuppressive drugs, initial encounter: Secondary | ICD-10-CM | POA: Insufficient documentation

## 2017-05-22 DIAGNOSIS — D701 Agranulocytosis secondary to cancer chemotherapy: Secondary | ICD-10-CM | POA: Diagnosis present

## 2017-05-22 DIAGNOSIS — C49A4 Gastrointestinal stromal tumor of large intestine: Secondary | ICD-10-CM | POA: Diagnosis present

## 2017-05-22 DIAGNOSIS — X58XXXA Exposure to other specified factors, initial encounter: Secondary | ICD-10-CM | POA: Insufficient documentation

## 2017-05-22 LAB — COMPREHENSIVE METABOLIC PANEL
ALK PHOS: 51 U/L (ref 38–126)
ALT: 19 U/L (ref 14–54)
AST: 48 U/L — AB (ref 15–41)
Albumin: 3.8 g/dL (ref 3.5–5.0)
Anion gap: 9 (ref 5–15)
BILIRUBIN TOTAL: 1.4 mg/dL — AB (ref 0.3–1.2)
BUN: 20 mg/dL (ref 6–20)
CO2: 25 mmol/L (ref 22–32)
CREATININE: 1.06 mg/dL — AB (ref 0.44–1.00)
Calcium: 8.8 mg/dL — ABNORMAL LOW (ref 8.9–10.3)
Chloride: 104 mmol/L (ref 101–111)
GFR calc Af Amer: >60 mL/min (ref >60)
GFR calc non Af Amer: 53 mL/min — ABNORMAL LOW (ref >60)
Glucose, Bld: 104 mg/dL — ABNORMAL HIGH (ref 65–99)
Potassium: 4.1 mmol/L (ref 3.5–5.1)
Sodium: 138 mmol/L (ref 135–145)
TOTAL PROTEIN: 6.6 g/dL (ref 6.5–8.1)

## 2017-05-22 LAB — CBC WITH DIFFERENTIAL/PLATELET
BASOS ABS: 0.1 10*3/uL (ref 0.0–0.1)
Basophils Relative: 2 %
Eosinophils Absolute: 0.4 10*3/uL (ref 0.0–0.7)
Eosinophils Relative: 13 %
HEMATOCRIT: 35.7 % — AB (ref 36.0–46.0)
HEMOGLOBIN: 11.7 g/dL — AB (ref 12.0–15.0)
LYMPHS PCT: 30 %
Lymphs Abs: 0.9 10*3/uL (ref 0.7–4.0)
MCH: 31 pg (ref 26.0–34.0)
MCHC: 32.8 g/dL (ref 30.0–36.0)
MCV: 94.4 fL (ref 78.0–100.0)
MONO ABS: 0.5 10*3/uL (ref 0.1–1.0)
Monocytes Relative: 15 %
NEUTROS ABS: 1.2 10*3/uL — AB (ref 1.7–7.7)
Neutrophils Relative %: 40 %
Platelets: 166 10*3/uL (ref 150–400)
RBC: 3.78 MIL/uL — AB (ref 3.87–5.11)
RDW: 13.6 % (ref 11.5–15.5)
WBC: 3.1 10*3/uL — ABNORMAL LOW (ref 4.0–10.5)

## 2017-06-24 ENCOUNTER — Other Ambulatory Visit (HOSPITAL_COMMUNITY)
Admission: RE | Admit: 2017-06-24 | Discharge: 2017-06-24 | Disposition: A | Discharge status: Home / Self Care | Payer: Medicare Other | Source: Other Acute Inpatient Hospital | Attending: Hematology and Oncology | Admitting: Hematology and Oncology

## 2017-06-24 DIAGNOSIS — C499 Malignant neoplasm of connective and soft tissue, unspecified: Secondary | ICD-10-CM | POA: Diagnosis present

## 2017-06-24 LAB — COMPREHENSIVE METABOLIC PANEL
ALBUMIN: 4 g/dL (ref 3.5–5.0)
ALT: 22 U/L (ref 14–54)
ANION GAP: 10 (ref 5–15)
AST: 50 U/L — ABNORMAL HIGH (ref 15–41)
Alkaline Phosphatase: 49 U/L (ref 38–126)
BUN: 19 mg/dL (ref 6–20)
CO2: 25 mmol/L (ref 22–32)
Calcium: 8.8 mg/dL — ABNORMAL LOW (ref 8.9–10.3)
Chloride: 104 mmol/L (ref 101–111)
Creatinine, Ser: 0.99 mg/dL (ref 0.44–1.00)
GFR calc non Af Amer: 57 mL/min — ABNORMAL LOW (ref >60)
GLUCOSE: 99 mg/dL (ref 65–99)
POTASSIUM: 3.9 mmol/L (ref 3.5–5.1)
SODIUM: 139 mmol/L (ref 135–145)
Total Bilirubin: 1.8 mg/dL — ABNORMAL HIGH (ref 0.3–1.2)
Total Protein: 6.9 g/dL (ref 6.5–8.1)

## 2017-06-24 LAB — CBC WITH DIFFERENTIAL/PLATELET
BASOS ABS: 0 10*3/uL (ref 0.0–0.1)
BASOS PCT: 1 %
Eosinophils Absolute: 0.3 10*3/uL (ref 0.0–0.7)
Eosinophils Relative: 9 %
HEMATOCRIT: 34.9 % — AB (ref 36.0–46.0)
HEMOGLOBIN: 11.3 g/dL — AB (ref 12.0–15.0)
Lymphocytes Relative: 32 %
Lymphs Abs: 0.9 10*3/uL (ref 0.7–4.0)
MCH: 30.4 pg (ref 26.0–34.0)
MCHC: 32.4 g/dL (ref 30.0–36.0)
MCV: 93.8 fL (ref 78.0–100.0)
MONO ABS: 0.2 10*3/uL (ref 0.1–1.0)
Monocytes Relative: 8 %
NEUTROS ABS: 1.5 10*3/uL — AB (ref 1.7–7.7)
NEUTROS PCT: 50 %
Platelets: 183 10*3/uL (ref 150–400)
RBC: 3.72 MIL/uL — ABNORMAL LOW (ref 3.87–5.11)
RDW: 13.7 % (ref 11.5–15.5)
WBC: 2.9 10*3/uL — ABNORMAL LOW (ref 4.0–10.5)

## 2017-07-24 DIAGNOSIS — R918 Other nonspecific abnormal finding of lung field: Secondary | ICD-10-CM | POA: Diagnosis not present

## 2017-07-24 DIAGNOSIS — C49A0 Gastrointestinal stromal tumor, unspecified site: Secondary | ICD-10-CM | POA: Diagnosis not present

## 2017-07-24 DIAGNOSIS — C189 Malignant neoplasm of colon, unspecified: Secondary | ICD-10-CM | POA: Diagnosis not present

## 2017-07-24 DIAGNOSIS — C49A4 Gastrointestinal stromal tumor of large intestine: Secondary | ICD-10-CM | POA: Diagnosis not present

## 2017-08-18 DIAGNOSIS — Z933 Colostomy status: Secondary | ICD-10-CM | POA: Diagnosis not present

## 2017-10-22 IMAGING — MR MR PELVIS WO/W CM
9 of 17 series · 23 of 48 positions shown · IV contrast (multihance)
Comparison: 11/13/2009

CLINICAL DATA: Pelvic mass on physical exam.  Fibroids.

Creatinine was obtained on site at [HOSPITAL] at [HOSPITAL].
Results: Creatinine 1.1 mg/dL.
EXAM:
MRI PELVIS WITHOUT AND WITH CONTRAST
TECHNIQUE: Multiplanar multisequence MR imaging of the pelvis was performed
both before and after administration of intravenous contrast.
CONTRAST:  14mL MULTIHANCE GADOBENATE DIMEGLUMINE 529 MG/ML IV SOLN

[Series 2: cor haste · coronal · 6.0mm · 0.70mm/px · 1 of 22 slices shown]
[im 1/22]
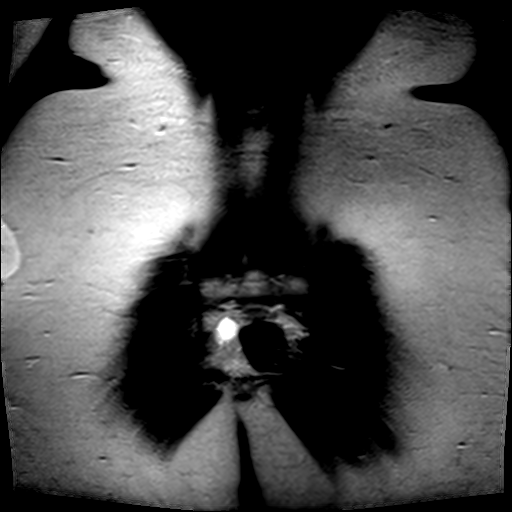

[Series 3: t2_tse_sag · sagittal · 5.0mm · 1.05mm/px · 2 of 29 slices shown]
[im 1/29]
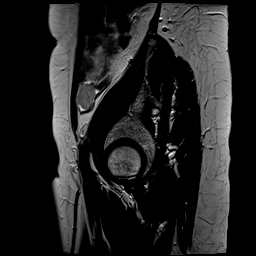
[im 29/29]
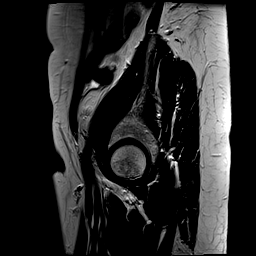

[Series 4: t2_tse axial · axial · 6.0mm · 0.98mm/px · z∈[-122,+87]mm · 2 of 30 slices shown]
[im 1/30]
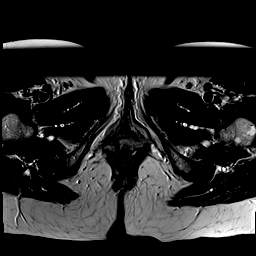
[im 30/30]
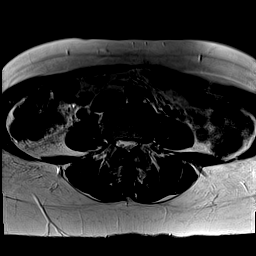

[Series 5: t2_tse axial fs · axial · 6.0mm · 0.49mm/px · z∈[-122,+87]mm · 2 of 30 slices shown]
[im 1/30]
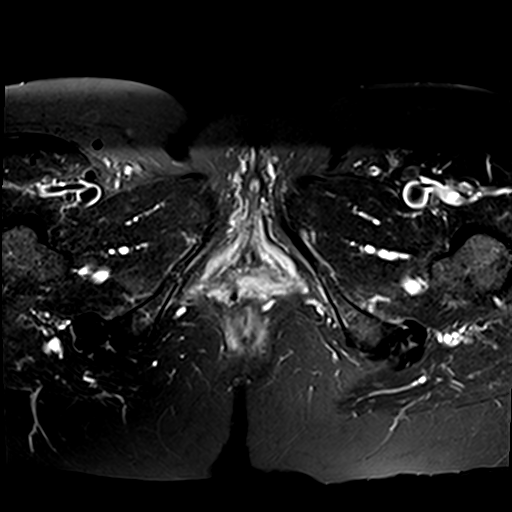
[im 30/30]
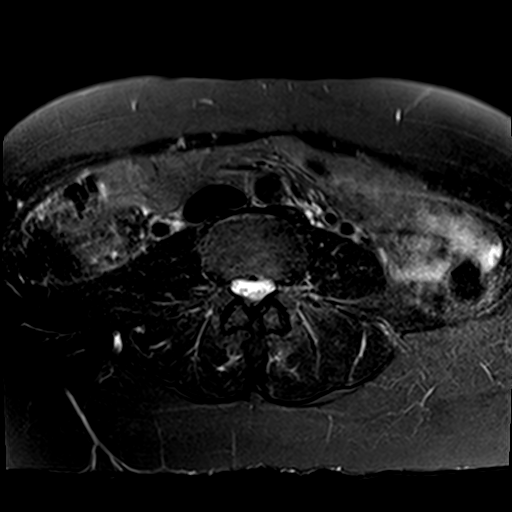

[Series 6: axial spgr · axial · 7.0mm · 0.49mm/px · z∈[-114,+88]mm · 2 of 25 slices shown]
[im 1/25]
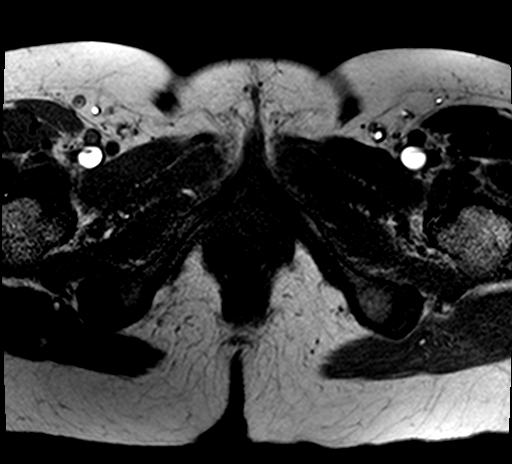
[im 25/25]
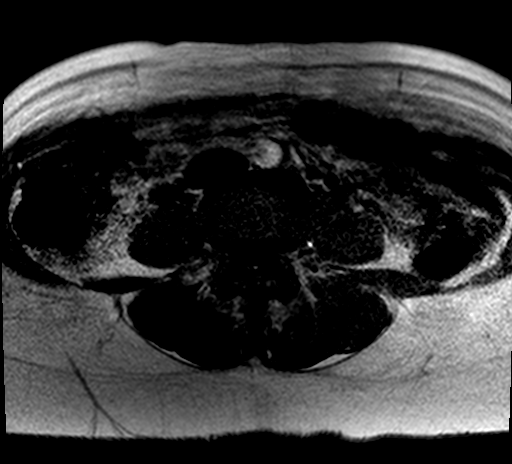

[Series 7: axial spgr pre · axial · non-contrast · 7.0mm · 0.98mm/px · z∈[-114,+88]mm · 2 of 25 slices shown]
[im 1/25]
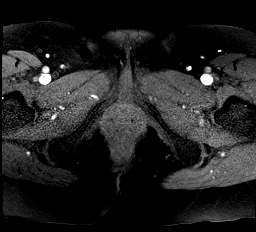
[im 25/25]
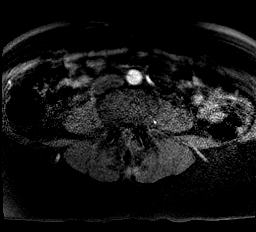

[Series 8: T1 dynamic · sagittal · non-contrast · 4.0mm · 0.49mm/px · 4 of 48 slices shown]
[im 1/48]
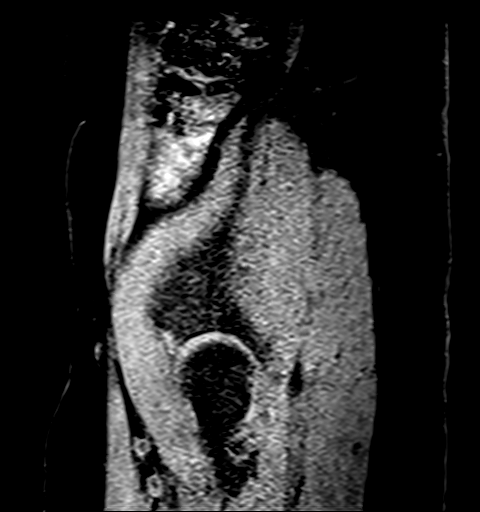
[im 16/48]
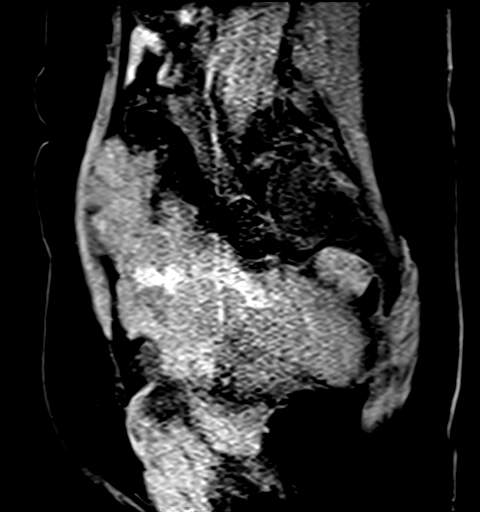
[im 32/48]
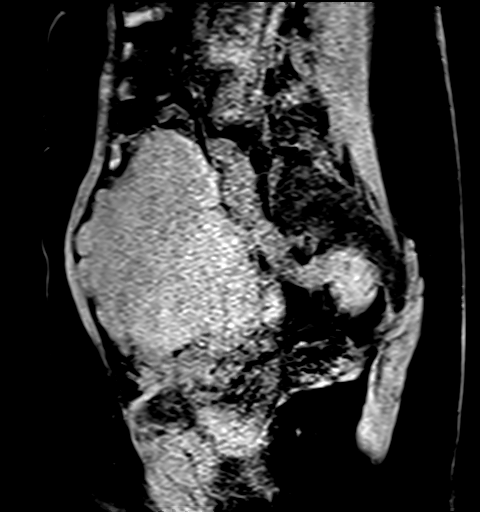
[im 48/48]
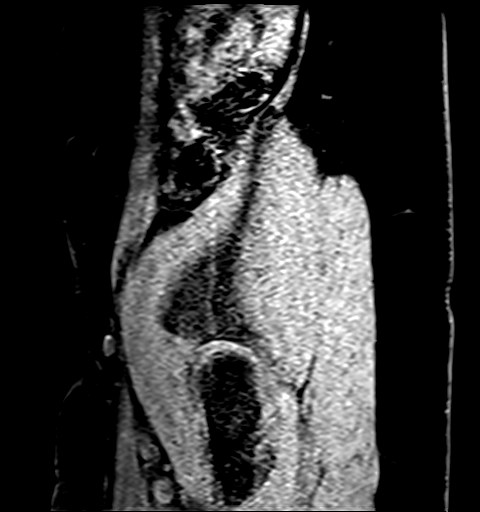

[Series 9: T1 dynamic post-contrast · sagittal · 4.0mm · 0.49mm/px · 4 of 48 slices shown (1 of 2)]
[im 1/48]
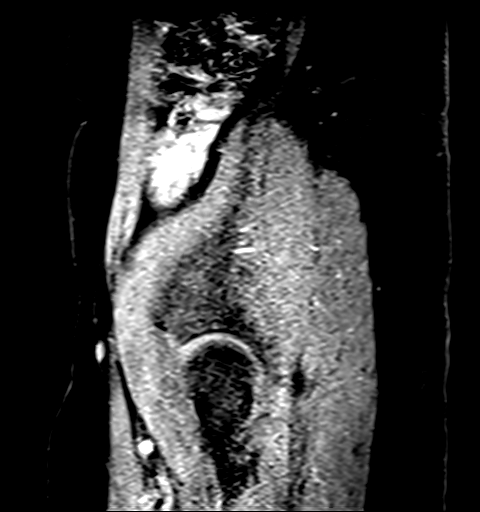
[im 16/48]
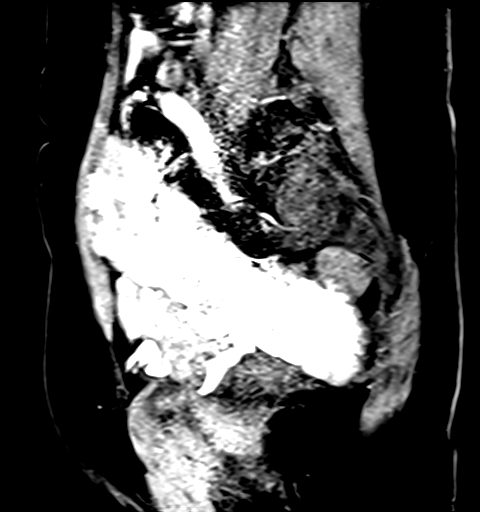
[im 32/48]
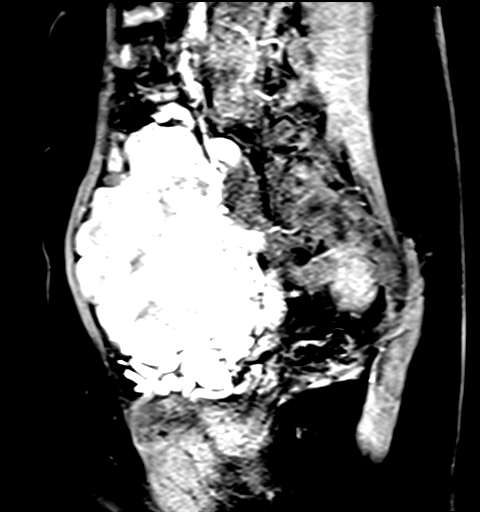
[im 48/48]
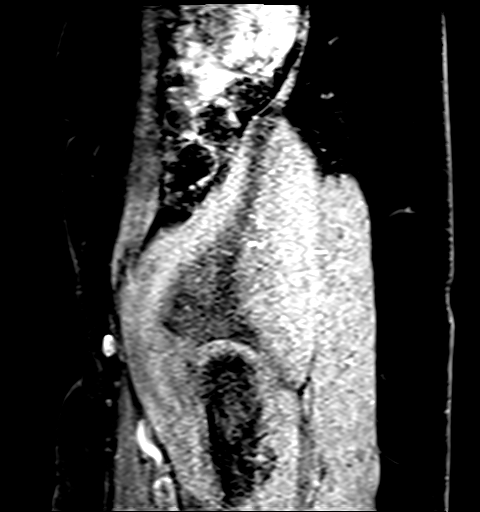

[Series 10: T1 dynamic post-contrast · sagittal · 4.0mm · 0.49mm/px · 4 of 46 slices shown (2 of 2)]
[im 1/46]
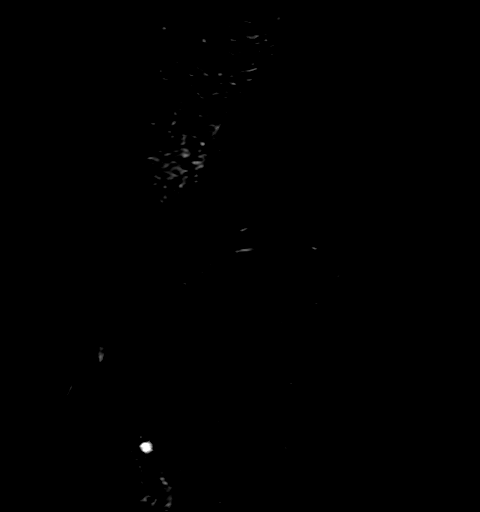
[im 16/46]
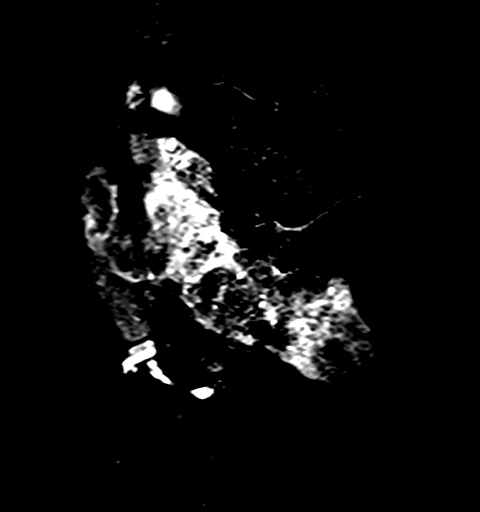
[im 31/46]
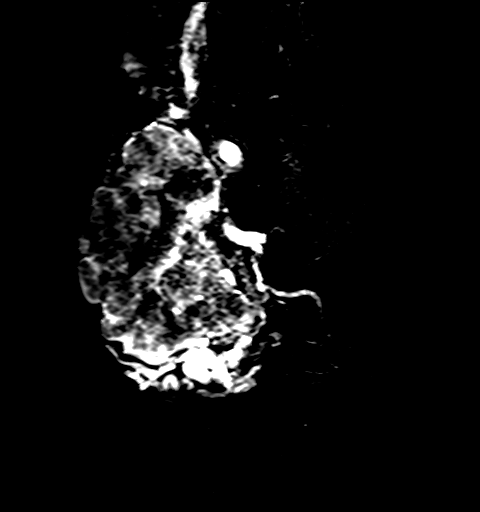
[im 46/46]
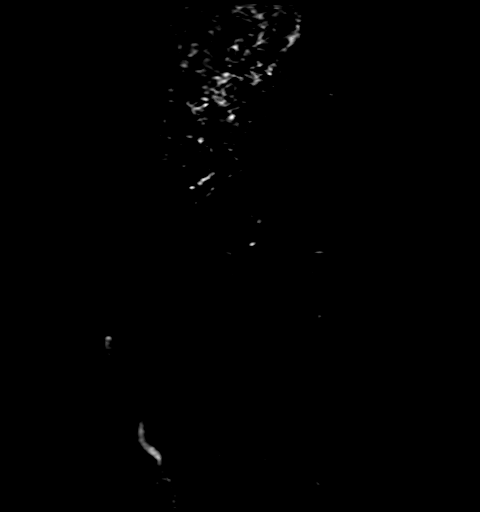

[23 of 48 positions shown; findings below may reference images not displayed]

FINDINGS: Urinary Tract: Unremarkable urinary bladder.

Bowel: No evidence of bowel obstruction.

Vascular/Lymphatic: No pathologically enlarged lymph nodes
identified.

Reproductive:

Uterus: Measures 7.9 x 3.7 x 5.6 cm. A few small uterine fibroids
are again seen, largest measuring 2.6 cm. Thin endometrium. Normal
appearance of cervix.

Right ovary: Not visualized ; see below.

Left ovary:  Not visualized; see below.

Other: A large lobular and heterogeneously enhancing soft tissue
mass is seen which surrounds the uterus and involves both adnexal
regions, left side greater than right. This mass also extends into
the lower abdomen, and measures 17.1 by 10.2 x 11.5 cm. A similar
appearing but small soft tissue mass is seen in the anterior right
lower quadrant measuring 2.6 x 2.3 cm. Tiny amount of pelvic free
fluid noted. These findings are new since prior study, and
suspicious for ovarian carcinoma with peritoneal implant in the
anterior right lower quadrant. Other differential considerations
including primary peritoneal carcinoma and metastatic disease.

Musculoskeletal:  Unremarkable.
IMPRESSION: Large pelvic soft tissue mass which surrounds the uterus and
involves both adnexal regions, left side greater than right. Small
mass also seen in the anterior right lower quadrant suspicious for
peritoneal tumor implant. Minimal ascites. This is suspicious for
ovarian carcinoma, with other differential considerations including
primary peritoneal carcinoma and metastatic disease.

Several small uterine fibroids, largest measuring 2.6 cm.

These results will be called to the ordering clinician or
representative by the Radiologist Assistant, and communication
documented in the PACS or zVision Dashboard.

## 2017-11-13 DIAGNOSIS — M791 Myalgia, unspecified site: Secondary | ICD-10-CM | POA: Diagnosis not present

## 2017-11-13 DIAGNOSIS — R918 Other nonspecific abnormal finding of lung field: Secondary | ICD-10-CM | POA: Diagnosis not present

## 2017-11-13 DIAGNOSIS — Z933 Colostomy status: Secondary | ICD-10-CM | POA: Diagnosis not present

## 2017-11-13 DIAGNOSIS — R188 Other ascites: Secondary | ICD-10-CM | POA: Diagnosis not present

## 2017-11-13 DIAGNOSIS — Z801 Family history of malignant neoplasm of trachea, bronchus and lung: Secondary | ICD-10-CM | POA: Diagnosis not present

## 2017-11-13 DIAGNOSIS — Z79899 Other long term (current) drug therapy: Secondary | ICD-10-CM | POA: Diagnosis not present

## 2017-11-13 DIAGNOSIS — E039 Hypothyroidism, unspecified: Secondary | ICD-10-CM | POA: Diagnosis not present

## 2017-11-13 DIAGNOSIS — C189 Malignant neoplasm of colon, unspecified: Secondary | ICD-10-CM | POA: Diagnosis not present

## 2017-11-13 DIAGNOSIS — Z90722 Acquired absence of ovaries, bilateral: Secondary | ICD-10-CM | POA: Diagnosis not present

## 2017-11-13 DIAGNOSIS — C49A0 Gastrointestinal stromal tumor, unspecified site: Secondary | ICD-10-CM | POA: Diagnosis not present

## 2017-11-13 DIAGNOSIS — M2550 Pain in unspecified joint: Secondary | ICD-10-CM | POA: Diagnosis not present

## 2017-11-13 DIAGNOSIS — Z9049 Acquired absence of other specified parts of digestive tract: Secondary | ICD-10-CM | POA: Diagnosis not present

## 2017-11-13 DIAGNOSIS — C786 Secondary malignant neoplasm of retroperitoneum and peritoneum: Secondary | ICD-10-CM | POA: Diagnosis not present

## 2017-11-13 DIAGNOSIS — D259 Leiomyoma of uterus, unspecified: Secondary | ICD-10-CM | POA: Diagnosis not present

## 2017-11-13 DIAGNOSIS — C49A4 Gastrointestinal stromal tumor of large intestine: Secondary | ICD-10-CM | POA: Diagnosis not present

## 2017-11-13 DIAGNOSIS — Z881 Allergy status to other antibiotic agents status: Secondary | ICD-10-CM | POA: Diagnosis not present

## 2017-11-18 DIAGNOSIS — Z933 Colostomy status: Secondary | ICD-10-CM | POA: Diagnosis not present

## 2017-12-29 DIAGNOSIS — H11823 Conjunctivochalasis, bilateral: Secondary | ICD-10-CM | POA: Diagnosis not present

## 2017-12-29 DIAGNOSIS — H353131 Nonexudative age-related macular degeneration, bilateral, early dry stage: Secondary | ICD-10-CM | POA: Diagnosis not present

## 2017-12-29 DIAGNOSIS — H43813 Vitreous degeneration, bilateral: Secondary | ICD-10-CM | POA: Diagnosis not present

## 2017-12-29 DIAGNOSIS — H2513 Age-related nuclear cataract, bilateral: Secondary | ICD-10-CM | POA: Diagnosis not present

## 2017-12-31 DIAGNOSIS — H532 Diplopia: Secondary | ICD-10-CM | POA: Diagnosis not present

## 2018-01-25 DIAGNOSIS — R82998 Other abnormal findings in urine: Secondary | ICD-10-CM | POA: Diagnosis not present

## 2018-01-25 DIAGNOSIS — R7309 Other abnormal glucose: Secondary | ICD-10-CM | POA: Diagnosis not present

## 2018-01-25 DIAGNOSIS — I10 Essential (primary) hypertension: Secondary | ICD-10-CM | POA: Diagnosis not present

## 2018-01-25 DIAGNOSIS — E559 Vitamin D deficiency, unspecified: Secondary | ICD-10-CM | POA: Diagnosis not present

## 2018-01-25 DIAGNOSIS — E038 Other specified hypothyroidism: Secondary | ICD-10-CM | POA: Diagnosis not present

## 2018-02-01 DIAGNOSIS — Z0000 Encounter for general adult medical examination without abnormal findings: Secondary | ICD-10-CM | POA: Diagnosis not present

## 2018-02-01 DIAGNOSIS — E46 Unspecified protein-calorie malnutrition: Secondary | ICD-10-CM | POA: Diagnosis not present

## 2018-02-01 DIAGNOSIS — G608 Other hereditary and idiopathic neuropathies: Secondary | ICD-10-CM | POA: Diagnosis not present

## 2018-02-01 DIAGNOSIS — H538 Other visual disturbances: Secondary | ICD-10-CM | POA: Diagnosis not present

## 2018-02-18 DIAGNOSIS — H524 Presbyopia: Secondary | ICD-10-CM | POA: Diagnosis not present

## 2018-03-08 DIAGNOSIS — Z933 Colostomy status: Secondary | ICD-10-CM | POA: Diagnosis not present

## 2018-03-09 ENCOUNTER — Other Ambulatory Visit (HOSPITAL_COMMUNITY): Payer: Self-pay | Admitting: Respiratory Therapy

## 2018-03-09 DIAGNOSIS — R0602 Shortness of breath: Secondary | ICD-10-CM

## 2018-03-12 DIAGNOSIS — Z933 Colostomy status: Secondary | ICD-10-CM | POA: Diagnosis not present

## 2018-03-15 DIAGNOSIS — Z933 Colostomy status: Secondary | ICD-10-CM | POA: Diagnosis not present

## 2018-03-19 DIAGNOSIS — C189 Malignant neoplasm of colon, unspecified: Secondary | ICD-10-CM | POA: Diagnosis not present

## 2018-03-19 DIAGNOSIS — C49A0 Gastrointestinal stromal tumor, unspecified site: Secondary | ICD-10-CM | POA: Diagnosis not present

## 2018-03-19 DIAGNOSIS — C49A4 Gastrointestinal stromal tumor of large intestine: Secondary | ICD-10-CM | POA: Diagnosis not present

## 2018-03-19 DIAGNOSIS — R918 Other nonspecific abnormal finding of lung field: Secondary | ICD-10-CM | POA: Diagnosis not present

## 2018-03-25 ENCOUNTER — Ambulatory Visit (HOSPITAL_COMMUNITY)
Admission: RE | Admit: 2018-03-25 | Discharge: 2018-03-25 | Disposition: A | Discharge status: Home / Self Care | Payer: Medicare Other | Source: Ambulatory Visit | Attending: Internal Medicine | Admitting: Internal Medicine

## 2018-03-25 DIAGNOSIS — R0602 Shortness of breath: Secondary | ICD-10-CM | POA: Diagnosis not present

## 2018-03-25 LAB — PULMONARY FUNCTION TEST
DL/VA % pred: 109 %
DL/VA: 4.56 ml/min/mmHg/L
DLCO unc % pred: 83 %
DLCO unc: 16.35 ml/min/mmHg
FEF 25-75 Post: 3.04 L/sec
FEF 25-75 Pre: 2.93 L/sec
FEF2575-%CHANGE-POST: 3 %
FEF2575-%Pred-Post: 156 %
FEF2575-%Pred-Pre: 150 %
FEV1-%Change-Post: 0 %
FEV1-%PRED-POST: 98 %
FEV1-%PRED-PRE: 98 %
FEV1-POST: 2.26 L
FEV1-PRE: 2.25 L
FEV1FVC-%CHANGE-POST: 1 %
FEV1FVC-%Pred-Pre: 113 %
FEV6-%CHANGE-POST: -1 %
FEV6-%PRED-PRE: 89 %
FEV6-%Pred-Post: 88 %
FEV6-POST: 2.57 L
FEV6-PRE: 2.6 L
FEV6FVC-%Change-Post: 0 %
FEV6FVC-%PRED-POST: 104 %
FEV6FVC-%PRED-PRE: 104 %
FVC-%CHANGE-POST: -1 %
FVC-%PRED-PRE: 85 %
FVC-%Pred-Post: 84 %
FVC-POST: 2.57 L
FVC-PRE: 2.6 L
POST FEV6/FVC RATIO: 100 %
Post FEV1/FVC ratio: 88 %
Pre FEV1/FVC ratio: 87 %
Pre FEV6/FVC Ratio: 100 %
RV % PRED: 77 %
RV: 1.69 L
TLC % PRED: 84 %
TLC: 4.26 L

## 2018-03-25 MED ORDER — ALBUTEROL SULFATE (2.5 MG/3ML) 0.083% IN NEBU
2.5000 mg | INHALATION_SOLUTION | Freq: Once | RESPIRATORY_TRACT | Status: AC
  Administered 2018-03-25: 2.5 mg via RESPIRATORY_TRACT

## 2018-04-28 ENCOUNTER — Ambulatory Visit: Payer: Medicare Other | Admitting: Neurology

## 2018-07-19 DIAGNOSIS — Z933 Colostomy status: Secondary | ICD-10-CM | POA: Diagnosis not present

## 2018-07-23 DIAGNOSIS — R918 Other nonspecific abnormal finding of lung field: Secondary | ICD-10-CM | POA: Diagnosis not present

## 2018-07-23 DIAGNOSIS — C49A0 Gastrointestinal stromal tumor, unspecified site: Secondary | ICD-10-CM | POA: Diagnosis not present

## 2018-07-23 DIAGNOSIS — Z79899 Other long term (current) drug therapy: Secondary | ICD-10-CM | POA: Diagnosis not present

## 2018-07-23 DIAGNOSIS — C49A4 Gastrointestinal stromal tumor of large intestine: Secondary | ICD-10-CM | POA: Diagnosis not present

## 2018-07-23 DIAGNOSIS — E039 Hypothyroidism, unspecified: Secondary | ICD-10-CM | POA: Diagnosis not present

## 2018-07-23 DIAGNOSIS — C189 Malignant neoplasm of colon, unspecified: Secondary | ICD-10-CM | POA: Diagnosis not present

## 2018-07-23 DIAGNOSIS — E559 Vitamin D deficiency, unspecified: Secondary | ICD-10-CM | POA: Diagnosis not present

## 2018-09-03 ENCOUNTER — Other Ambulatory Visit (HOSPITAL_COMMUNITY)
Admission: RE | Admit: 2018-09-03 | Discharge: 2018-09-03 | Disposition: A | Discharge status: Home / Self Care | Payer: Medicare Other | Source: Ambulatory Visit | Attending: Hematology and Oncology | Admitting: Hematology and Oncology

## 2018-09-03 DIAGNOSIS — C499 Malignant neoplasm of connective and soft tissue, unspecified: Secondary | ICD-10-CM | POA: Diagnosis not present

## 2018-09-03 LAB — CBC WITH DIFFERENTIAL/PLATELET
Abs Immature Granulocytes: 0.01 10*3/uL (ref 0.00–0.07)
Basophils Absolute: 0 10*3/uL (ref 0.0–0.1)
Basophils Relative: 2 %
Eosinophils Absolute: 0.3 10*3/uL (ref 0.0–0.5)
Eosinophils Relative: 11 %
HCT: 36.5 % (ref 36.0–46.0)
Hemoglobin: 11.3 g/dL — ABNORMAL LOW (ref 12.0–15.0)
Immature Granulocytes: 0 %
Lymphocytes Relative: 33 %
Lymphs Abs: 0.9 10*3/uL (ref 0.7–4.0)
MCH: 30.4 pg (ref 26.0–34.0)
MCHC: 31 g/dL (ref 30.0–36.0)
MCV: 98.1 fL (ref 80.0–100.0)
Monocytes Absolute: 0.2 10*3/uL (ref 0.1–1.0)
Monocytes Relative: 8 %
Neutro Abs: 1.3 10*3/uL — ABNORMAL LOW (ref 1.7–7.7)
Neutrophils Relative %: 46 %
Platelets: 162 10*3/uL (ref 150–400)
RBC: 3.72 MIL/uL — ABNORMAL LOW (ref 3.87–5.11)
RDW: 14 % (ref 11.5–15.5)
WBC: 2.8 10*3/uL — ABNORMAL LOW (ref 4.0–10.5)
nRBC: 0 % (ref 0.0–0.2)

## 2018-09-03 LAB — COMPREHENSIVE METABOLIC PANEL
ALT: 15 U/L (ref 0–44)
AST: 39 U/L (ref 15–41)
Albumin: 4 g/dL (ref 3.5–5.0)
Alkaline Phosphatase: 50 U/L (ref 38–126)
Anion gap: 8 (ref 5–15)
BUN: 14 mg/dL (ref 8–23)
CO2: 26 mmol/L (ref 22–32)
Calcium: 8.8 mg/dL — ABNORMAL LOW (ref 8.9–10.3)
Chloride: 105 mmol/L (ref 98–111)
Creatinine, Ser: 1.13 mg/dL — ABNORMAL HIGH (ref 0.44–1.00)
GFR calc Af Amer: 57 mL/min — ABNORMAL LOW (ref >60)
GFR calc non Af Amer: 50 mL/min — ABNORMAL LOW (ref >60)
Glucose, Bld: 102 mg/dL — ABNORMAL HIGH (ref 70–99)
Potassium: 4.2 mmol/L (ref 3.5–5.1)
Sodium: 139 mmol/L (ref 135–145)
Total Bilirubin: 1.8 mg/dL — ABNORMAL HIGH (ref 0.3–1.2)
Total Protein: 6.8 g/dL (ref 6.5–8.1)

## 2018-09-20 DIAGNOSIS — C49A4 Gastrointestinal stromal tumor of large intestine: Secondary | ICD-10-CM | POA: Diagnosis not present

## 2018-09-20 DIAGNOSIS — C786 Secondary malignant neoplasm of retroperitoneum and peritoneum: Secondary | ICD-10-CM | POA: Diagnosis not present

## 2018-11-03 ENCOUNTER — Encounter: Payer: Self-pay | Admitting: Gynecology

## 2018-11-24 DIAGNOSIS — C189 Malignant neoplasm of colon, unspecified: Secondary | ICD-10-CM | POA: Diagnosis not present

## 2018-11-24 DIAGNOSIS — C49A0 Gastrointestinal stromal tumor, unspecified site: Secondary | ICD-10-CM | POA: Diagnosis not present

## 2018-11-24 DIAGNOSIS — Z08 Encounter for follow-up examination after completed treatment for malignant neoplasm: Secondary | ICD-10-CM | POA: Diagnosis not present

## 2018-11-24 DIAGNOSIS — Z933 Colostomy status: Secondary | ICD-10-CM | POA: Diagnosis not present

## 2018-11-24 DIAGNOSIS — C49A4 Gastrointestinal stromal tumor of large intestine: Secondary | ICD-10-CM | POA: Diagnosis not present

## 2018-11-24 DIAGNOSIS — C786 Secondary malignant neoplasm of retroperitoneum and peritoneum: Secondary | ICD-10-CM | POA: Diagnosis not present

## 2018-11-24 DIAGNOSIS — Z85030 Personal history of malignant carcinoid tumor of large intestine: Secondary | ICD-10-CM | POA: Diagnosis not present

## 2018-12-13 DIAGNOSIS — Z933 Colostomy status: Secondary | ICD-10-CM | POA: Diagnosis not present

## 2018-12-16 DIAGNOSIS — Z933 Colostomy status: Secondary | ICD-10-CM | POA: Diagnosis not present

## 2018-12-31 DIAGNOSIS — H2513 Age-related nuclear cataract, bilateral: Secondary | ICD-10-CM | POA: Diagnosis not present

## 2018-12-31 DIAGNOSIS — H11823 Conjunctivochalasis, bilateral: Secondary | ICD-10-CM | POA: Diagnosis not present

## 2018-12-31 DIAGNOSIS — H353131 Nonexudative age-related macular degeneration, bilateral, early dry stage: Secondary | ICD-10-CM | POA: Diagnosis not present

## 2018-12-31 DIAGNOSIS — H43813 Vitreous degeneration, bilateral: Secondary | ICD-10-CM | POA: Diagnosis not present

## 2019-02-01 DIAGNOSIS — E559 Vitamin D deficiency, unspecified: Secondary | ICD-10-CM | POA: Diagnosis not present

## 2019-02-01 DIAGNOSIS — R739 Hyperglycemia, unspecified: Secondary | ICD-10-CM | POA: Diagnosis not present

## 2019-02-01 DIAGNOSIS — I10 Essential (primary) hypertension: Secondary | ICD-10-CM | POA: Diagnosis not present

## 2019-02-01 DIAGNOSIS — E039 Hypothyroidism, unspecified: Secondary | ICD-10-CM | POA: Diagnosis not present

## 2019-02-07 DIAGNOSIS — I10 Essential (primary) hypertension: Secondary | ICD-10-CM | POA: Diagnosis not present

## 2019-02-07 DIAGNOSIS — R82998 Other abnormal findings in urine: Secondary | ICD-10-CM | POA: Diagnosis not present

## 2019-02-23 DIAGNOSIS — C49A4 Gastrointestinal stromal tumor of large intestine: Secondary | ICD-10-CM | POA: Diagnosis not present

## 2019-05-25 DIAGNOSIS — C49A4 Gastrointestinal stromal tumor of large intestine: Secondary | ICD-10-CM | POA: Diagnosis not present

## 2019-05-25 DIAGNOSIS — C49A0 Gastrointestinal stromal tumor, unspecified site: Secondary | ICD-10-CM | POA: Diagnosis not present

## 2019-05-25 DIAGNOSIS — G629 Polyneuropathy, unspecified: Secondary | ICD-10-CM | POA: Diagnosis not present

## 2019-05-25 DIAGNOSIS — E559 Vitamin D deficiency, unspecified: Secondary | ICD-10-CM | POA: Diagnosis not present

## 2019-05-25 DIAGNOSIS — C189 Malignant neoplasm of colon, unspecified: Secondary | ICD-10-CM | POA: Diagnosis not present

## 2019-06-02 DIAGNOSIS — Z933 Colostomy status: Secondary | ICD-10-CM | POA: Diagnosis not present

## 2019-08-10 ENCOUNTER — Other Ambulatory Visit (HOSPITAL_COMMUNITY)
Admission: RE | Admit: 2019-08-10 | Discharge: 2019-08-10 | Disposition: A | Discharge status: Home / Self Care | Payer: Medicare Other | Source: Ambulatory Visit | Attending: Student | Admitting: Student

## 2019-08-10 DIAGNOSIS — C49A4 Gastrointestinal stromal tumor of large intestine: Secondary | ICD-10-CM | POA: Diagnosis not present

## 2019-08-10 LAB — CBC WITH DIFFERENTIAL/PLATELET
Abs Immature Granulocytes: 0.01 10*3/uL (ref 0.00–0.07)
Basophils Absolute: 0.1 10*3/uL (ref 0.0–0.1)
Basophils Relative: 2 %
Eosinophils Absolute: 0.3 10*3/uL (ref 0.0–0.5)
Eosinophils Relative: 10 %
HCT: 35.8 % — ABNORMAL LOW (ref 36.0–46.0)
Hemoglobin: 11.4 g/dL — ABNORMAL LOW (ref 12.0–15.0)
Immature Granulocytes: 0 %
Lymphocytes Relative: 29 %
Lymphs Abs: 1 10*3/uL (ref 0.7–4.0)
MCH: 31.2 pg (ref 26.0–34.0)
MCHC: 31.8 g/dL (ref 30.0–36.0)
MCV: 98.1 fL (ref 80.0–100.0)
Monocytes Absolute: 0.3 10*3/uL (ref 0.1–1.0)
Monocytes Relative: 9 %
Neutro Abs: 1.7 10*3/uL (ref 1.7–7.7)
Neutrophils Relative %: 50 %
Platelets: 168 10*3/uL (ref 150–400)
RBC: 3.65 MIL/uL — ABNORMAL LOW (ref 3.87–5.11)
RDW: 14 % (ref 11.5–15.5)
WBC: 3.4 10*3/uL — ABNORMAL LOW (ref 4.0–10.5)
nRBC: 0 % (ref 0.0–0.2)

## 2019-08-10 LAB — COMPREHENSIVE METABOLIC PANEL
ALT: 18 U/L (ref 0–44)
AST: 42 U/L — ABNORMAL HIGH (ref 15–41)
Albumin: 4.2 g/dL (ref 3.5–5.0)
Alkaline Phosphatase: 55 U/L (ref 38–126)
Anion gap: 12 (ref 5–15)
BUN: 23 mg/dL (ref 8–23)
CO2: 26 mmol/L (ref 22–32)
Calcium: 9.3 mg/dL (ref 8.9–10.3)
Chloride: 101 mmol/L (ref 98–111)
Creatinine, Ser: 1.08 mg/dL — ABNORMAL HIGH (ref 0.44–1.00)
GFR calc Af Amer: >60 mL/min (ref >60)
GFR calc non Af Amer: 52 mL/min — ABNORMAL LOW (ref >60)
Glucose, Bld: 97 mg/dL (ref 70–99)
Potassium: 4.6 mmol/L (ref 3.5–5.1)
Sodium: 139 mmol/L (ref 135–145)
Total Bilirubin: 1.6 mg/dL — ABNORMAL HIGH (ref 0.3–1.2)
Total Protein: 7.1 g/dL (ref 6.5–8.1)

## 2019-08-17 DIAGNOSIS — Z8589 Personal history of malignant neoplasm of other organs and systems: Secondary | ICD-10-CM | POA: Diagnosis not present

## 2019-08-17 DIAGNOSIS — Z08 Encounter for follow-up examination after completed treatment for malignant neoplasm: Secondary | ICD-10-CM | POA: Diagnosis not present

## 2019-08-17 DIAGNOSIS — Z9049 Acquired absence of other specified parts of digestive tract: Secondary | ICD-10-CM | POA: Diagnosis not present

## 2019-08-17 DIAGNOSIS — Z85038 Personal history of other malignant neoplasm of large intestine: Secondary | ICD-10-CM | POA: Diagnosis not present

## 2019-10-12 DIAGNOSIS — Z933 Colostomy status: Secondary | ICD-10-CM | POA: Diagnosis not present

## 2019-11-23 DIAGNOSIS — G629 Polyneuropathy, unspecified: Secondary | ICD-10-CM | POA: Diagnosis not present

## 2019-11-23 DIAGNOSIS — M791 Myalgia, unspecified site: Secondary | ICD-10-CM | POA: Diagnosis not present

## 2019-11-23 DIAGNOSIS — C49A4 Gastrointestinal stromal tumor of large intestine: Secondary | ICD-10-CM | POA: Diagnosis not present

## 2019-11-23 DIAGNOSIS — D709 Neutropenia, unspecified: Secondary | ICD-10-CM | POA: Diagnosis not present

## 2019-11-23 DIAGNOSIS — C189 Malignant neoplasm of colon, unspecified: Secondary | ICD-10-CM | POA: Diagnosis not present

## 2019-11-23 DIAGNOSIS — R918 Other nonspecific abnormal finding of lung field: Secondary | ICD-10-CM | POA: Diagnosis not present

## 2019-11-23 DIAGNOSIS — Z602 Problems related to living alone: Secondary | ICD-10-CM | POA: Diagnosis not present

## 2019-11-23 DIAGNOSIS — Z933 Colostomy status: Secondary | ICD-10-CM | POA: Diagnosis not present

## 2019-11-23 DIAGNOSIS — R1011 Right upper quadrant pain: Secondary | ICD-10-CM | POA: Diagnosis not present

## 2019-11-23 DIAGNOSIS — R1900 Intra-abdominal and pelvic swelling, mass and lump, unspecified site: Secondary | ICD-10-CM | POA: Diagnosis not present

## 2019-11-23 DIAGNOSIS — C49A0 Gastrointestinal stromal tumor, unspecified site: Secondary | ICD-10-CM | POA: Diagnosis not present

## 2020-01-02 DIAGNOSIS — H43813 Vitreous degeneration, bilateral: Secondary | ICD-10-CM | POA: Diagnosis not present

## 2020-01-02 DIAGNOSIS — H11823 Conjunctivochalasis, bilateral: Secondary | ICD-10-CM | POA: Diagnosis not present

## 2020-01-02 DIAGNOSIS — H2513 Age-related nuclear cataract, bilateral: Secondary | ICD-10-CM | POA: Diagnosis not present

## 2020-01-02 DIAGNOSIS — H353131 Nonexudative age-related macular degeneration, bilateral, early dry stage: Secondary | ICD-10-CM | POA: Diagnosis not present

## 2020-02-06 DIAGNOSIS — E039 Hypothyroidism, unspecified: Secondary | ICD-10-CM | POA: Diagnosis not present

## 2020-02-06 DIAGNOSIS — R739 Hyperglycemia, unspecified: Secondary | ICD-10-CM | POA: Diagnosis not present

## 2020-02-06 DIAGNOSIS — I10 Essential (primary) hypertension: Secondary | ICD-10-CM | POA: Diagnosis not present

## 2020-02-06 DIAGNOSIS — E559 Vitamin D deficiency, unspecified: Secondary | ICD-10-CM | POA: Diagnosis not present

## 2020-02-13 DIAGNOSIS — M1990 Unspecified osteoarthritis, unspecified site: Secondary | ICD-10-CM | POA: Diagnosis not present

## 2020-02-13 DIAGNOSIS — Z0000 Encounter for general adult medical examination without abnormal findings: Secondary | ICD-10-CM | POA: Diagnosis not present

## 2020-02-13 DIAGNOSIS — R739 Hyperglycemia, unspecified: Secondary | ICD-10-CM | POA: Diagnosis not present

## 2020-02-13 DIAGNOSIS — I10 Essential (primary) hypertension: Secondary | ICD-10-CM | POA: Diagnosis not present

## 2020-02-21 DIAGNOSIS — C49A4 Gastrointestinal stromal tumor of large intestine: Secondary | ICD-10-CM | POA: Diagnosis not present

## 2020-02-29 DIAGNOSIS — Z933 Colostomy status: Secondary | ICD-10-CM | POA: Diagnosis not present

## 2020-03-19 DIAGNOSIS — E039 Hypothyroidism, unspecified: Secondary | ICD-10-CM | POA: Diagnosis not present

## 2020-03-19 DIAGNOSIS — I10 Essential (primary) hypertension: Secondary | ICD-10-CM | POA: Diagnosis not present

## 2020-03-19 DIAGNOSIS — Z8509 Personal history of malignant neoplasm of other digestive organs: Secondary | ICD-10-CM | POA: Diagnosis not present

## 2020-03-19 DIAGNOSIS — R2681 Unsteadiness on feet: Secondary | ICD-10-CM | POA: Diagnosis not present

## 2020-04-02 DIAGNOSIS — Z9181 History of falling: Secondary | ICD-10-CM | POA: Diagnosis not present

## 2020-04-02 DIAGNOSIS — R2681 Unsteadiness on feet: Secondary | ICD-10-CM | POA: Diagnosis not present

## 2020-04-02 DIAGNOSIS — R531 Weakness: Secondary | ICD-10-CM | POA: Diagnosis not present

## 2020-04-04 DIAGNOSIS — Z9181 History of falling: Secondary | ICD-10-CM | POA: Diagnosis not present

## 2020-04-04 DIAGNOSIS — R2681 Unsteadiness on feet: Secondary | ICD-10-CM | POA: Diagnosis not present

## 2020-04-04 DIAGNOSIS — R531 Weakness: Secondary | ICD-10-CM | POA: Diagnosis not present

## 2020-04-10 DIAGNOSIS — R531 Weakness: Secondary | ICD-10-CM | POA: Diagnosis not present

## 2020-04-10 DIAGNOSIS — R2681 Unsteadiness on feet: Secondary | ICD-10-CM | POA: Diagnosis not present

## 2020-04-10 DIAGNOSIS — Z9181 History of falling: Secondary | ICD-10-CM | POA: Diagnosis not present

## 2020-04-12 DIAGNOSIS — R2681 Unsteadiness on feet: Secondary | ICD-10-CM | POA: Diagnosis not present

## 2020-04-12 DIAGNOSIS — Z9181 History of falling: Secondary | ICD-10-CM | POA: Diagnosis not present

## 2020-04-12 DIAGNOSIS — R531 Weakness: Secondary | ICD-10-CM | POA: Diagnosis not present

## 2020-04-16 DIAGNOSIS — R2681 Unsteadiness on feet: Secondary | ICD-10-CM | POA: Diagnosis not present

## 2020-04-16 DIAGNOSIS — R531 Weakness: Secondary | ICD-10-CM | POA: Diagnosis not present

## 2020-04-16 DIAGNOSIS — Z9181 History of falling: Secondary | ICD-10-CM | POA: Diagnosis not present

## 2020-04-18 DIAGNOSIS — R2681 Unsteadiness on feet: Secondary | ICD-10-CM | POA: Diagnosis not present

## 2020-04-18 DIAGNOSIS — Z9181 History of falling: Secondary | ICD-10-CM | POA: Diagnosis not present

## 2020-04-18 DIAGNOSIS — R531 Weakness: Secondary | ICD-10-CM | POA: Diagnosis not present

## 2020-04-19 ENCOUNTER — Encounter: Payer: Self-pay | Admitting: Neurology

## 2020-04-19 ENCOUNTER — Ambulatory Visit: Payer: Medicare Other | Admitting: Neurology

## 2020-04-19 VITALS — BP 103/54 | HR 86 | Ht 64.0 in | Wt 139.0 lb

## 2020-04-19 DIAGNOSIS — M21371 Foot drop, right foot: Secondary | ICD-10-CM

## 2020-04-19 DIAGNOSIS — R270 Ataxia, unspecified: Secondary | ICD-10-CM | POA: Diagnosis not present

## 2020-04-19 DIAGNOSIS — M6281 Muscle weakness (generalized): Secondary | ICD-10-CM | POA: Diagnosis not present

## 2020-04-19 DIAGNOSIS — M21372 Foot drop, left foot: Secondary | ICD-10-CM

## 2020-04-19 DIAGNOSIS — R278 Other lack of coordination: Secondary | ICD-10-CM

## 2020-04-19 NOTE — Patient Instructions (Signed)
MRI of the brain and cervical spine EMG/NCS Blood work  Leory Plowman and Daroff's neurology in clinical practice (8th ed., pp. 6314- 1929). Elsevier."> Goldman-Cecil medicine (26th ed., pp. 2489- 2501). Elsevier.">  Peripheral Neuropathy Peripheral neuropathy is a type of nerve damage. It affects nerves that carry signals between the spinal cord and the arms, legs, and the rest of the body (peripheral nerves). It does not affect nerves in the spinal cord or brain. In peripheral neuropathy, one nerve or a group of nerves may be damaged. Peripheral neuropathy is a broad category that includes many specific nerve disorders, like diabetic neuropathy, hereditary neuropathy, and carpal tunnel syndrome. What are the causes? This condition may be caused by:  Diabetes. This is the most common cause of peripheral neuropathy.  Nerve injury.  Pressure or stress on a nerve that lasts a long time.  Lack (deficiency) of B vitamins. This can result from alcoholism, poor diet, or a restricted diet.  Infections.  Autoimmune diseases, such as rheumatoid arthritis and systemic lupus erythematosus.  Nerve diseases that are passed from parent to child (inherited).  Some medicines, such as cancer medicines (chemotherapy).  Poisonous (toxic) substances, such as lead and mercury.  Too little blood flowing to the legs.  Kidney disease.  Thyroid disease. In some cases, the cause of this condition is not known. What are the signs or symptoms? Symptoms of this condition depend on which of your nerves is damaged. Common symptoms include:  Loss of feeling (numbness) in the feet, hands, or both.  Tingling in the feet, hands, or both.  Burning pain.  Very sensitive skin.  Weakness.  Not being able to move a part of the body (paralysis).  Muscle twitching.  Clumsiness or poor coordination.  Loss of balance.  Not being able to control your bladder.  Feeling dizzy.  Sexual problems. How is this  diagnosed? Diagnosing and finding the cause of peripheral neuropathy can be difficult. Your health care provider will take your medical history and do a physical exam. A neurological exam will also be done. This involves checking things that are affected by your brain, spinal cord, and nerves (nervous system). For example, your health care provider will check your reflexes, how you move, and what you can feel. You may have other tests, such as:  Blood tests.  Electromyogram (EMG) and nerve conduction tests. These tests check nerve function and how well the nerves are controlling the muscles.  Imaging tests, such as CT scans or MRI to rule out other causes of your symptoms.  Removing a small piece of nerve to be examined in a lab (nerve biopsy).  Removing and examining a small amount of the fluid that surrounds the brain and spinal cord (lumbar puncture). How is this treated? Treatment for this condition may involve:  Treating the underlying cause of the neuropathy, such as diabetes, kidney disease, or vitamin deficiencies.  Stopping medicines that can cause neuropathy, such as chemotherapy.  Medicine to help relieve pain. Medicines may include: ? Prescription or over-the-counter pain medicine. ? Antiseizure medicine. ? Antidepressants. ? Pain-relieving patches that are applied to painful areas of skin.  Surgery to relieve pressure on a nerve or to destroy a nerve that is causing pain.  Physical therapy to help improve movement and balance.  Devices to help you move around (assistive devices). Follow these instructions at home: Medicines  Take over-the-counter and prescription medicines only as told by your health care provider. Do not take any other medicines without first asking  your health care provider.  Do not drive or use heavy machinery while taking prescription pain medicine. Lifestyle  Do not use any products that contain nicotine or tobacco, such as cigarettes and  e-cigarettes. Smoking keeps blood from reaching damaged nerves. If you need help quitting, ask your health care provider.  Avoid or limit alcohol. Too much alcohol can cause a vitamin B deficiency, and vitamin B is needed for healthy nerves.  Eat a healthy diet. This includes: ? Eating foods that are high in fiber, such as fresh fruits and vegetables, whole grains, and beans. ? Limiting foods that are high in fat and processed sugars, such as fried or sweet foods.   General instructions  If you have diabetes, work closely with your health care provider to keep your blood sugar under control.  If you have numbness in your feet: ? Check every day for signs of injury or infection. Watch for redness, warmth, and swelling. ? Wear padded socks and comfortable shoes. These help protect your feet.  Develop a good support system. Living with peripheral neuropathy can be stressful. Consider talking with a mental health specialist or joining a support group.  Use assistive devices and attend physical therapy as told by your health care provider. This may include using a walker or a cane.  Keep all follow-up visits as told by your health care provider. This is important.   Contact a health care provider if:  You have new signs or symptoms of peripheral neuropathy.  You are struggling emotionally from dealing with peripheral neuropathy.  Your pain is not well-controlled. Get help right away if:  You have an injury or infection that is not healing normally.  You develop new weakness in an arm or leg.  You have fallen or do so frequently. Summary  Peripheral neuropathy is when the nerves in the arms, or legs are damaged, resulting in numbness, weakness, or pain.  There are many causes of peripheral neuropathy, including diabetes, pinched nerves, vitamin deficiencies, autoimmune disease, and hereditary conditions.  Diagnosing and finding the cause of peripheral neuropathy can be difficult.  Your health care provider will take your medical history, do a physical exam, and do tests, including blood tests and nerve function tests.  Treatment involves treating the underlying cause of the neuropathy and taking medicines to help control pain. Physical therapy and assistive devices may also help. This information is not intended to replace advice given to you by your health care provider. Make sure you discuss any questions you have with your health care provider. Document Revised: 10/25/2019 Document Reviewed: 10/25/2019 Elsevier Patient Education  2021 [place] is a test to check how well your muscles and nerves are working. This procedure includes the combined use of electromyogram (EMG) and nerve conduction study (NCS). EMG is used to look for muscular disorders. NCS, which is also called electroneurogram, measures how well your nerves are controlling your muscles. The procedures are usually done together to check if your muscles and nerves are healthy. If the results of the tests are abnormal, this may indicate disease or injury, such as a neuromuscular disease or peripheral nerve damage. Tell a health care provider about:  Any allergies you have.  All medicines you are taking, including vitamins, herbs, eye drops, creams, and over-the-counter medicines.  Any problems you or family members have had with anesthetic medicines.  Any blood disorders you have.  Any surgeries you have had.  Any medical conditions you have.  If you have a pacemaker.  Whether you are pregnant or may be pregnant. What are the risks? Generally, this is a safe procedure. However, problems may occur, including:  Infection where the electrodes were inserted.  Bleeding. What happens before the procedure? Medicines Ask your health care provider about:  Changing or stopping your regular medicines. This is especially important if you are taking diabetes  medicines or blood thinners.  Taking medicines such as aspirin and ibuprofen. These medicines can thin your blood. Do not take these medicines unless your health care provider tells you to take them.  Taking over-the-counter medicines, vitamins, herbs, and supplements. General instructions  Your health care provider may ask you to avoid: ? Beverages that have caffeine, such as coffee and tea. ? Any products that contain nicotine or tobacco. These products include cigarettes, e-cigarettes, and chewing tobacco. If you need help quitting, ask your health care provider.  Do not use lotions or creams on the same day that you will be having the procedure. What happens during the procedure? For EMG  Your health care provider will ask you to stay in a position so that he or she can access the muscle that will be studied. You may be standing, sitting, or lying down.  You may be given a medicine that numbs the area (local anesthetic).  A very thin needle that has an electrode will be inserted into your muscle.  Another small electrode will be placed on your skin near the muscle.  Your health care provider will ask you to continue to remain still.  The electrodes will send a signal that tells about the electrical activity of your muscles. You may see this on a monitor or hear it in the room.  After your muscles have been studied at rest, your health care provider will ask you to contract or flex your muscles. The electrodes will send a signal that tells about the electrical activity of your muscles.  Your health care provider will remove the electrodes and the electrode needles when the procedure is finished. The procedure may vary among health care providers and hospitals.   For NCS  An electrode that records your nerve activity (recording electrode) will be placed on your skin by the muscle that is being studied.  An electrode that is used as a reference (reference electrode) will be placed  near the recording electrode.  A paste or gel will be applied to your skin between the recording electrode and the reference electrode.  Your nerve will be stimulated with a mild shock. Your health care provider will measure how much time it takes for your muscle to react.  Your health care provider will remove the electrodes and the gel when the procedure is finished. The procedure may vary among health care providers and hospitals.   What happens after the procedure?  It is up to you to get the results of your procedure. Ask your health care provider, or the department that is doing the procedure, when your results will be ready.  Your health care provider may: ? Give you medicines for any pain. ? Monitor the insertion sites to make sure that bleeding stops. Summary  Electromyoneurogram is a test to check how well your muscles and nerves are working.  If the results of the tests are abnormal, this may indicate disease or injury.  This is a safe procedure. However, problems may occur, such as bleeding and infection.  Your health care provider will do two tests to  complete this procedure. One checks your muscles (EMG) and another checks your nerves (NCS).  It is up to you to get the results of your procedure. Ask your health care provider, or the department that is doing the procedure, when your results will be ready. This information is not intended to replace advice given to you by your health care provider. Make sure you discuss any questions you have with your health care provider. Document Revised: 09/29/2017 Document Reviewed: 09/11/2017 Elsevier Patient Education  [place].

## 2020-04-19 NOTE — Progress Notes (Addendum)
[facility]    Provider:  Dr Jaynee Eagles Requesting Provider: Leanna Battles, MD Primary Care Provider:  Leanna Battles, MD  CC:  abnormal gait  HPI:  Anna Figueroa is a 70 y.o. female here as requested by Leanna Battles, MD for unsteady gait and peripheral neuropathy. 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  I reviewed Dr. Buel Ream notes: Patient is a widow, husband had dementia, she has a daughter and a son, she has noted worsening strength and light touch sensation of both lower extremities, cause is uncertain, recent CT abdomen and pelvis showed L4-L5 anterior listhesis, she was sent to physical therapy to improve strength and balance.  She is here alone. In late 2017 she was visiting her son/daughter and her daughter noticed her walking "funny", she later identified a mass in her abdomen and had surgery GIST sarcoma, in June 2018 she was started in [facility] and she had a strange sensation in her legs and hands, her imbalance progressed, she continues on Gleevec, she used to walk about 20 miles a week, she had breathing problems on the [place], Reaching to hold onto things. She turned her ankle on an uneven surface, and went down but otherwise no falls. She is going to physical therapy which may be helping.  Myasthenia gravis panel was normal. She does not report significant sensory changes in the hands or feet, she has some numbness. 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 She has neck discomfort and some in the low back but no radicular symptoms. No other focal neurologic deficits, associated symptoms, inciting events or modifiable factors.  Reviewed notes, labs and imaging from outside physicians, which showed: see above  Review of Systems: Patient complains of symptoms per HPI as well as the following symptoms: imbalance.  Pertinent negatives and positives per HPI. All others negative.   Social History   Socioeconomic History  . Marital status: Widowed    Spouse name: Not on file  . Number of children: Not on file  . Years of education: Not on file  . Highest education level: Not on file  Occupational History  . Not on file  Tobacco Use  . Smoking status: Never Smoker  . Smokeless tobacco: Never Used  Substance and Sexual Activity  . Alcohol use: No  . Drug use: No  . Sexual activity: Never    Birth control/protection: Post-menopausal    Comment: 1st intercourse 47 yo-5 partners  Other Topics Concern  . Not on file  Social History Narrative   Lives at home alone   left handed   Caffeine: 2-3 cups tea/day   Social Determinants of Health   Financial Resource Strain: Not on file  Food Insecurity: Not on file  Transportation Needs: Not on file  Physical Activity: Not on file  Stress: Not on file  Social Connections: Not on file  Intimate Partner Violence: Not on file    Family History  Problem Relation Age of Onset  . Hypertension Mother   . Heart disease Mother   . Osteoporosis Mother   . Thyroid disease Mother   . Myelodysplastic syndrome Mother   . Hyperlipidemia Mother   . Lung cancer Father   . Diabetes Brother   . Cancer Brother        Leukemia  . Myelodysplastic syndrome Brother   . Thyroid disease Sister   . Celiac disease Daughter   .  Hyperlipidemia Other        6 siblings   . Hyperlipidemia Niece     Past Medical History:  Diagnosis Date  . Fibroid 2017   fibroid mass benign LLQ, followed by gynecologist  . Fibroid uterus    with disintegrating fibroids; GIST stage IV metastatic in 2018  . GIST, malignant ([place])   . Osteoarthritis    of the hands  . Thyroid disease    Hypothyroid  . Vitamin D deficiency     Patient Active Problem List   Diagnosis Date Noted  . Pelvic mass in female 03/19/2016  . Vitamin D deficiency   . Thyroid disease     Past  Surgical History:  Procedure Laterality Date  . CESAREAN SECTION    . DILATION AND CURETTAGE OF UTERUS    . OOPHORECTOMY  2018  . removal of GIST sarcoma tumor  2018  . SALPINGECTOMY  2018  . SIGMOIDECTOMY  2018    Current Outpatient Medications  Medication Sig Dispense Refill  . Cholecalciferol (VITAMIN D PO) Take 5,000 Units by mouth 3 (three) times a week.    . imatinib (GLEEVEC) 100 MG tablet Take 300 mg by mouth daily. Take with meals and large glass of water.Caution:Chemotherapy    . SYNTHROID 112 MCG tablet Take one tablet by mouth every day. 90 tablet 4   No current facility-administered medications for this visit.    Allergies as of 04/19/2020 - Review Complete 04/19/2020  Allergen Reaction Noted  . Other Rash 04/19/2020  . Sulfamethoxazole-trimethoprim Rash 05/23/2016    Vitals: BP (!) 103/54 (BP Location: Left Arm, Patient Position: Sitting)   Pulse 86   Ht 5' 4"  (1.626 m)   Wt 139 lb (63 kg)   BMI 23.86 kg/m  Last Weight:  Wt Readings from Last 1 Encounters:  04/19/20 139 lb (63 kg)   Last Height:   Ht Readings from Last 1 Encounters:  04/19/20 5' 4"  (1.626 m)     Physical exam: Exam: Gen: NAD, conversant, well nourised, well groomed                     CV: RRR, no MRG. No Carotid Bruits. No peripheral edema, warm, nontender Eyes: Conjunctivae clear without exudates or hemorrhage  Neuro: Detailed Neurologic Exam  Speech:    Speech is normal; fluent and spontaneous with normal comprehension.  Cognition:    The patient is oriented to person, place, and time;     recent and remote memory intact;     language fluent;     normal attention, concentration,     fund of knowledge Cranial Nerves:    The pupils are equal, round, and reactive to light. Pupils too small to visualize fundi.  Visual fields are full to finger confrontation. Extraocular movements are intact. Trigeminal sensation is intact and the muscles of mastication are normal. The face is  symmetric. The palate elevates in the midline. Hearing intact. Voice is normal. Shoulder shrug is normal. The tongue has normal motion without fasciculations.   Coordination:    Normal finger to nose and heel to shin.    Gait:    Wide based, good arm swing. Difficulty with toe/heel walk and imbalance with tandem.Mild steppage gait.  Ataxic.  Motor Observation:    No asymmetry, no atrophy, and no involuntary movements noted. Tone:    Normal muscle tone.    Posture:    Posture is normal. normal erect    Strength:  Strength is V/V in the upper and lower limbs. DF/PF weakness.      Sensation: decrease to pin prick and temp, absent vibration     Reflex Exam:  DTR's: trace AJs otherwise deep tendon reflexes in the upper and lower extremities are 1+ bilaterally.   Toes:    The toes are equiv bilaterally.   Clonus:    Clonus is absent.    Assessment/Plan:  She does not report significant sensory changes in the hands or feet, she has some numbness. She does have distal weakness in dorsiflexion and plantar flexion. She appears to have a sensory ataxia with distal weakness.   MRI brain and cervical spine EMG/NCS one arm and one leg Unclear etiology, may consider: Gleevec as a cause. Paraneoplastic antibodies uncommon in this type of cancer, consider Biopsy or genetic testing after workup  Addendum 05/16/2020: Updates  - Extensive blood work was unremarkable including CBC, CMP, ANA, multiple myeloma panel/B6/heavy metals, rheumatoid factor, gliadin, celiac disease, Sjogren's, B1, A1c, methylmalonic acid, B12 and folate.  - EMG/NCS shows bilateral carpal tunnel syndrome in the upper extremities. All sensory and motor conductions showed no response in the lower extremities.   - MRI of the brain and cervical spine were both unremarkable without etiology.I'm not aware of GIST or Gleevec being highly associated with peripheral neuropathy and a literature search also did not find anything of  substance to support that but need to suspect Gleevec in the absence of other causes. - She is treated at [facility] for her GIST, she may need biopsy, genetic testing (including amyloidosis), we will perform [employer]ae panel of 70+ genes involved in polyneuropathy including familial/hereditary amyloidosis.  Will check vitamin E and copper today. Will also order MRI thoracic and Lumbar spines w/wo contrast to evaluate for inflammatory causes of neuropathy, nerve-root enhancement.  - Patient has no pain and does not have any significant subjective sensory changes, she has reflexes, unlikely CIDP but consider a lumbar puncture as well.  - Paraneoplastic?  Genetic such as amyloidosis or other? Consider IVIG? I think she needs an academic center at this point and since she received treatment for her malignancy at [facility] will send her there they also have an excellent neurology department.  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  Check with Dr Junius Roads about carpal tunnel: He does not perform surgeries anymore, will refer her to [facility].  Genetic testing - yes MRI thoracic w/wo Doreene Burke w/wo - look for nerve root enhancement for inflammatory causes  No orders of the defined types were placed in this encounter.  No orders of the defined types were placed in this encounter.   Cc: Leanna Battles, MD,  Leanna Battles, MD  Sarina Ill, MD  [facility] [address] [place], [place] [address]  Phone [phone] Fax [phone]

## 2020-04-20 LAB — HEAVY METALS, BLOOD

## 2020-04-20 LAB — MULTIPLE MYELOMA PANEL, SERUM

## 2020-04-21 LAB — COMPREHENSIVE METABOLIC PANEL
ALT: 15 IU/L (ref 0–32)
BUN: 21 mg/dL (ref 8–27)

## 2020-04-21 LAB — CBC: MCHC: 32.6 g/dL (ref 31.5–35.7)

## 2020-04-21 LAB — B12 AND FOLATE PANEL: Folate: 5.8 ng/mL (ref >3.0)

## 2020-04-22 ENCOUNTER — Encounter: Payer: Self-pay | Admitting: Neurology

## 2020-04-23 ENCOUNTER — Telehealth: Payer: Self-pay | Admitting: Neurology

## 2020-04-23 DIAGNOSIS — R531 Weakness: Secondary | ICD-10-CM | POA: Diagnosis not present

## 2020-04-23 DIAGNOSIS — R2681 Unsteadiness on feet: Secondary | ICD-10-CM | POA: Diagnosis not present

## 2020-04-23 DIAGNOSIS — Z9181 History of falling: Secondary | ICD-10-CM | POA: Diagnosis not present

## 2020-04-23 LAB — COMPREHENSIVE METABOLIC PANEL: Bilirubin Total: 1.1 mg/dL (ref 0.0–1.2)

## 2020-04-23 LAB — RHEUMATOID FACTOR: Rhuematoid fact SerPl-aCnc: 11.1 IU/mL (ref <14.0)

## 2020-04-23 LAB — B12 AND FOLATE PANEL: Vitamin B-12: 418 pg/mL (ref 232–1245)

## 2020-04-23 NOTE — Telephone Encounter (Signed)
[employer] medicare order sent to [facility]. No auth they will reach out to the patient to schedule.  

## 2020-04-24 LAB — COMPREHENSIVE METABOLIC PANEL
Calcium: 9.1 mg/dL (ref 8.7–10.3)
Globulin, Total: 2.3 g/dL (ref 1.5–4.5)
Potassium: 4.4 mmol/L (ref 3.5–5.2)

## 2020-04-24 LAB — HEMOGLOBIN A1C: Hgb A1c MFr Bld: 5.6 % (ref 4.8–5.6)

## 2020-04-24 LAB — METHYLMALONIC ACID, SERUM: Methylmalonic Acid: 322 nmol/L (ref 0–378)

## 2020-04-24 LAB — CBC: WBC: 3.4 10*3/uL (ref 3.4–10.8)

## 2020-04-25 DIAGNOSIS — Z9181 History of falling: Secondary | ICD-10-CM | POA: Diagnosis not present

## 2020-04-25 DIAGNOSIS — R531 Weakness: Secondary | ICD-10-CM | POA: Diagnosis not present

## 2020-04-25 DIAGNOSIS — R2681 Unsteadiness on feet: Secondary | ICD-10-CM | POA: Diagnosis not present

## 2020-04-25 LAB — MULTIPLE MYELOMA PANEL, SERUM

## 2020-04-25 LAB — VITAMIN B6: Vitamin B6: 6.5 ug/L (ref 2.0–32.8)

## 2020-04-25 LAB — COMPREHENSIVE METABOLIC PANEL: CO2: 24 mmol/L (ref 20–29)

## 2020-04-30 DIAGNOSIS — R531 Weakness: Secondary | ICD-10-CM | POA: Diagnosis not present

## 2020-04-30 DIAGNOSIS — R2681 Unsteadiness on feet: Secondary | ICD-10-CM | POA: Diagnosis not present

## 2020-04-30 DIAGNOSIS — Z9181 History of falling: Secondary | ICD-10-CM | POA: Diagnosis not present

## 2020-04-30 LAB — MULTIPLE MYELOMA PANEL, SERUM
Albumin SerPl Elph-Mcnc: 4 g/dL (ref 2.9–4.4)
Albumin/Glob SerPl: 1.7 (ref 0.7–1.7)
Alpha 1: 0.3 g/dL (ref 0.0–0.4)
Alpha2 Glob SerPl Elph-Mcnc: 0.6 g/dL (ref 0.4–1.0)
B-Globulin SerPl Elph-Mcnc: 0.9 g/dL (ref 0.7–1.3)
Gamma Glob SerPl Elph-Mcnc: 0.8 g/dL (ref 0.4–1.8)
Globulin, Total: 2.5 g/dL (ref 2.2–3.9)
IgA/Immunoglobulin A, Serum: 248 mg/dL (ref 64–422)
IgM (Immunoglobulin M), Srm: 49 mg/dL (ref 26–217)

## 2020-04-30 LAB — CBC
Hematocrit: 34.7 % (ref 34.0–46.6)
Hemoglobin: 11.3 g/dL (ref 11.1–15.9)
MCH: 31 pg (ref 26.6–33.0)
MCV: 95 fL (ref 79–97)
Platelets: 168 10*3/uL (ref 150–450)
RBC: 3.64 x10E6/uL — ABNORMAL LOW (ref 3.77–5.28)
RDW: 13.1 % (ref 11.7–15.4)

## 2020-04-30 LAB — HEMOGLOBIN A1C: Est. average glucose Bld gHb Est-mCnc: 114 mg/dL

## 2020-04-30 LAB — HEAVY METALS, BLOOD
Lead, Blood: <1 ug/dL (ref 0–4)
Mercury: <1 ug/L (ref 0.0–14.9)

## 2020-04-30 LAB — COMPREHENSIVE METABOLIC PANEL
AST: 33 IU/L (ref 0–40)
Albumin/Globulin Ratio: 1.8 (ref 1.2–2.2)
Albumin: 4.2 g/dL (ref 3.7–4.7)
Alkaline Phosphatase: 59 IU/L (ref 44–121)
BUN/Creatinine Ratio: 20 (ref 12–28)
Chloride: 104 mmol/L (ref 96–106)
Creatinine, Ser: 1.06 mg/dL — ABNORMAL HIGH (ref 0.57–1.00)
Glucose: 96 mg/dL (ref 65–99)
Sodium: 141 mmol/L (ref 134–144)
Total Protein: 6.5 g/dL (ref 6.0–8.5)
eGFR: 56 mL/min/{1.73_m2} — ABNORMAL LOW (ref >59)

## 2020-04-30 LAB — SJOGREN'S SYNDROME ANTIBODS(SSA + SSB)
ENA SSA (RO) Ab: <0.2 AI (ref 0.0–0.9)
ENA SSB (LA) Ab: <0.2 AI (ref 0.0–0.9)

## 2020-04-30 LAB — ANTINUCLEAR ANTIBODIES, IFA: ANA Titer 1: NEGATIVE

## 2020-04-30 LAB — GLIADIN ANTIBODIES, SERUM
Antigliadin Abs, IgA: 4 units (ref 0–19)
Gliadin IgG: 2 units (ref 0–19)

## 2020-04-30 LAB — VITAMIN B1: Thiamine: 116.9 nmol/L (ref 66.5–200.0)

## 2020-04-30 LAB — TISSUE TRANSGLUTAMINASE, IGA: Transglutaminase IgA: <2 U/mL (ref 0–3)

## 2020-05-07 ENCOUNTER — Other Ambulatory Visit: Payer: Self-pay

## 2020-05-07 ENCOUNTER — Ambulatory Visit
Admission: RE | Admit: 2020-05-07 | Discharge: 2020-05-07 | Disposition: A | Discharge status: Home / Self Care | Payer: Medicare Other | Source: Ambulatory Visit | Attending: Neurology | Admitting: Neurology

## 2020-05-07 DIAGNOSIS — R270 Ataxia, unspecified: Secondary | ICD-10-CM | POA: Diagnosis not present

## 2020-05-07 DIAGNOSIS — M21371 Foot drop, right foot: Secondary | ICD-10-CM

## 2020-05-07 DIAGNOSIS — M21372 Foot drop, left foot: Secondary | ICD-10-CM | POA: Diagnosis not present

## 2020-05-07 DIAGNOSIS — R278 Other lack of coordination: Secondary | ICD-10-CM | POA: Diagnosis not present

## 2020-05-07 DIAGNOSIS — M6281 Muscle weakness (generalized): Secondary | ICD-10-CM

## 2020-05-07 MED ORDER — GADOBENATE DIMEGLUMINE 529 MG/ML IV SOLN
12.0000 mL | Freq: Once | INTRAVENOUS | Status: AC | PRN
  Administered 2020-05-07: 12 mL via INTRAVENOUS

## 2020-05-10 ENCOUNTER — Telehealth: Payer: Self-pay | Admitting: *Deleted

## 2020-05-10 NOTE — Telephone Encounter (Addendum)
Spoke with patient and discussed results of MRI brain and MRI cervical spine as noted below.  Patient verbalized understanding and her questions were answered.  Imaging can be further discussed at her EMG/NCV appointment on Monday, April 18.  Patient verbalized appreciation for the call.   ------MRI c-spine results----- There is some arthritis in the neck that may be affecting/pinching nerves going down your arms in particular right arm. but the spinal cord looks normal and none of the arthritic changes would be affecting your legs or walking. We can discuss further at emg/ncs testing thanks  Written by Melvenia Beam, MD on 05/08/2020 2:53 PM EDT   ----- Message from Melvenia Beam, MD sent at 05/08/2020  2:49 PM EDT -----MRI of the brain looks fine, nothing concerning, only changes we see normally in aging. Thanks Dr. Jaynee Eagles

## 2020-05-14 ENCOUNTER — Ambulatory Visit: Payer: Medicare Other | Admitting: Neurology

## 2020-05-14 ENCOUNTER — Ambulatory Visit (INDEPENDENT_AMBULATORY_CARE_PROVIDER_SITE_OTHER): Payer: Medicare Other | Admitting: Neurology

## 2020-05-14 DIAGNOSIS — R278 Other lack of coordination: Secondary | ICD-10-CM

## 2020-05-14 DIAGNOSIS — M6281 Muscle weakness (generalized): Secondary | ICD-10-CM | POA: Diagnosis not present

## 2020-05-14 DIAGNOSIS — R200 Anesthesia of skin: Secondary | ICD-10-CM

## 2020-05-14 DIAGNOSIS — R270 Ataxia, unspecified: Secondary | ICD-10-CM

## 2020-05-14 DIAGNOSIS — R202 Paresthesia of skin: Secondary | ICD-10-CM

## 2020-05-14 DIAGNOSIS — R29898 Other symptoms and signs involving the musculoskeletal system: Secondary | ICD-10-CM

## 2020-05-14 DIAGNOSIS — W19XXXD Unspecified fall, subsequent encounter: Secondary | ICD-10-CM

## 2020-05-14 DIAGNOSIS — G5603 Carpal tunnel syndrome, bilateral upper limbs: Secondary | ICD-10-CM

## 2020-05-14 DIAGNOSIS — Z0289 Encounter for other administrative examinations: Secondary | ICD-10-CM

## 2020-05-14 NOTE — Progress Notes (Signed)
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  We reviewed workup to date today withpatient and next steps:   - Extensive blood work was unremarkable including CBC, CMP, ANA, multiple myeloma panel/B6/heavy metals, rheumatoid factor, gliadin, celiac disease, Sjogren's, B1, A1c, methylmalonic acid, B12 and folate.  - EMG/NCS shows bilateral carpal tunnel syndrome in the upper extremities. All sensory and motor conductions showed no response in the lower extremities.   MRI of the brain and cervical spine were both unremarkable without etiology.I'm not aware of GIST or Gleevec being highly associated with peripheral neuropathy and a literature search also did not find anything of substance to support that but need to suspect Gleevec in the absence of other causes. 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  Will check vitamin E and copper today. Will also order MRI thoracic and Lumbar spines w/wo contrast to evaluate for inflammatory causes of neuropathy, nerve-root enhancement.  - Patient has no pain and does not have any significant subjective sensory changes, she has reflexes, unlikely CIDP but consider a lumbar puncture as well.  - Paraneoplastic?  Genetic such as amyloidosis or other? Consider IVIG? I think she needs an academic center at this point and since she received treatment for her malignancy at [facility] will send her there they also have an excellent neurology department.  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  Check with Dr Junius Roads about carpal tunnel: He does not perform surgeries anymore, will refer her to [facility].  Genetic testing - yes MRI thoracic wo /lumbar w/wo - look for nerve root enhancement for inflammatory causes  Orders Placed This  Encounter  Procedures  . MR THORACIC SPINE W WO CONTRAST  . MR Lumbar Spine W Wo Contrast  . Copper, serum  . Vitamin E  . Ambulatory referral to Orthopedic Surgery  . Ambulatory referral to Neurology    I spent over 40 minutes of face-to-face and non-face-to-face time with patient on the  1. Bilateral carpal tunnel syndrome   2. Sensory ataxia   3. Ataxia   4. Fall, subsequent encounter   5. Distal muscle weakness   6. Numbness and tingling of both legs   7. Weakness of both legs    diagnosis.  This included previsit chart review, lab review, study review, order entry, electronic health record documentation, patient education on the different diagnostic and therapeutic options, counseling and coordination of care, risks and benefits of management, compliance, or risk factor reduction. This does not include time spent on emg/ncs

## 2020-05-15 NOTE — Progress Notes (Signed)
Full Name: Anna Figueroa Gender: Female MRN #: [id number] Date of Birth: March 16, 1950    Visit Date: 05/14/2020 12:40 Age: 70 Years Examining Physician: Sarina Ill, MD  Requesting Provider: Leanna Battles, MD Primary Care Provider:  Leanna Battles, MD  History: Patient with ataxia, painless sensory loss and weakness in dorsiflexion and plantar flexion; suspicion for sensory ataxia with distal weakness.   Summary: The right median APB motor nerve showed prolonged distal onset latency (5.9 ms, N<4.4) and reduced amplitude(0.8 mV, N>4) and decreased conduction velocity(92m/s,N>49). The left  median APB motor nerve showed no response.  The left and right peroneal motor nerves showed no response.  The left tibial motor nerve showed no response.  The left radial sensory nerve showed reduced amplitude (8 V, normal greater than 15).  The right radial sensory nerve showed reduced amplitude (7 V, normal greater than 15).  The left sural sensory nerve, left superficial peroneal sensory nerve, right superficial peroneal sensory nerve, left median orthodromic sensory nerve and right median orthodromic sensory nerve showed no response.  The left ulnar orthodromic sensory nerve showed delayed distal peak latency (3.6 ms, normal less than 3.1) and reduced amplitude (3 V, normal greater than 5).  The left ulnar orthodromic sensory nerve showed delayed distal peak latency (3.5 ms, normal less than 3.1) and reduced amplitude (4 V, normal greater than 5). F Wave studies indicate that the left tibial F wave showed no response.  The right median/ulnar (palm) comparison nerve showed prolonged distal peak latency (Median Palm, 7 ms, N<2.2) and abnormal peak latency difference (Median Palm-Ulnar Palm, 4.3 ms, N<0.4) with a relative median delay.    The left median/ulnar (palm) comparison nerve showed prolonged distal peak latency (Median Palm, 6.4 ms, N<2.2) and abnormal peak latency difference (Median  Palm-Ulnar Palm, 3.8 ms, N<0.4) with a relative median delay.    All remaining nerves (as indicated in the following tables) were within normal limits.    The right tibialis muscle showed increased motor unit amplitude, diminished motor unit recruitment and polyphasic motor units.  The right extensor houses longus showed increased spontaneous activity,increased motor unit amplitude, diminished motor unit recruitment and polyphasic motor units.  The right abductor hallucis showed increased spontaneous activity and diminished motor unit recruitment.  The left opponens pollicis muscle showed increased spontaneous activity and diminished motor unit recruitment.  The right opponens pollicis muscle showed increased spontaneous activity, increased motor unit amplitude, polyphasic motor units and diminished motor unit recruitment.  All remaining muscles (as indicated in the following tables) were within normal limits.      Conclusion: 1.  There is severe bilateral carpal tunnel syndrome. 2.  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   Sarina Ill M.D.  [facility] [address], [place], [place] [address] Tel: [phone] Fax: [phone]  Verbal informed consent was obtained from the patient, patient was informed of potential risk of procedure, including bruising, bleeding, hematoma formation, infection, muscle weakness, muscle pain, numbness, among others.        [place]    Nerve / Sites Muscle Latency Ref. Amplitude Ref. Rel Amp Segments Distance Velocity Ref. Area    ms ms mV mV %  cm m/s m/s mVms  L Median - APB     Wrist APB NR ?4.4 NR ?4.0 NR Wrist - APB 7   NR     Upper arm APB NR  NR  NR Upper arm - Wrist 21 NR ?  49 NR  R Median - APB     Wrist APB 5.9 ?4.4 0.8 ?4.0 100 Wrist - APB 7   6.2     Upper arm APB 11.1  0.7  85.2 Upper arm - Wrist 21 41 ?49 4.9  L Ulnar - ADM     Wrist ADM 3.1 ?3.3 6.8 ?5.0 100 Wrist - ADM 7   22.1      B.Elbow ADM 6.7  6.3  92.8 B.Elbow - Wrist 19 52 ?49 21.9     A.Elbow ADM 8.6  6.1  97.2 A.Elbow - B.Elbow 10 52 ?49 22.5  R Ulnar - ADM     Wrist ADM 3.2 ?3.3 5.7 ?5.0 100 Wrist - ADM 7   25.3     B.Elbow ADM 6.5  5.6  97.8 B.Elbow - Wrist 18 55 ?49 21.7     A.Elbow ADM 8.4  5.1  91.3 A.Elbow - B.Elbow 10 54 ?49 20.5  L Peroneal - EDB     Ankle EDB NR ?6.5 NR ?2.0 NR Ankle - EDB 9   NR     Fib head EDB NR  NR  NR Fib head - Ankle 29 NR ?44 NR     Pop fossa EDB NR  NR  NR Pop fossa - Fib head 10 NR ?44 NR         Pop fossa - Ankle      R Peroneal - EDB     Ankle EDB NR ?6.5 NR ?2.0 NR Ankle - EDB 9   NR     Fib head EDB NR  NR  NR Fib head - Ankle 29 NR ?44 NR     Pop fossa EDB NR  NR  NR Pop fossa - Fib head 10 NR ?44 NR         Pop fossa - Ankle      L Tibial - AH     Ankle AH NR ?5.8 NR ?4.0 NR Ankle - AH 9   NR     Pop fossa AH NR  NR  NR Pop fossa - Ankle 37 NR ?41 NR                   SSR    Nerve / Sites Latency   s  L Sympathetic - Foot     Foot 1.35         SNC    Nerve / Sites Rec. Site Peak Lat Ref.  Amp Ref. Segments Distance Peak Diff Ref.    ms ms V V  cm ms ms  L Radial - Anatomical snuff box (Forearm)     Forearm Wrist 2.7 ?2.9 8 ?15 Forearm - Wrist 10    R Radial - Anatomical snuff box (Forearm)     Forearm Wrist 2.9 ?2.9 7 ?15 Forearm - Wrist 10    L Sural - Ankle (Calf)     Calf Ankle NR ?4.4 NR ?6 Calf - Ankle 14    L Superficial peroneal - Ankle     Lat leg Ankle NR ?4.4 NR ?6 Lat leg - Ankle 14    R Superficial peroneal - Ankle     Lat leg Ankle NR ?4.4 NR ?6 Lat leg - Ankle 14    L Median, Ulnar - Transcarpal comparison     Median Palm Wrist 6.4 ?2.2 7 ?35 Median Palm - Wrist 8       Ulnar Palm Wrist 2.6 ?2.2 20 ?12  Ulnar Palm - Wrist 8          Median Palm - Ulnar Palm  3.8 ?0.4  R Median, Ulnar - Transcarpal comparison     Median Palm Wrist 7.0 ?2.2 8 ?35 Median Palm - Wrist 8       Ulnar Palm Wrist 2.7 ?2.2 15 ?12 Ulnar Palm - Wrist 8           Median Palm - Ulnar Palm  4.3 ?0.4  L Median - Orthodromic (Dig II, Mid palm)     Dig II Wrist NR ?3.4 NR ?10 Dig II - Wrist 13    R Median - Orthodromic (Dig II, Mid palm)     Dig II Wrist NR ?3.4 NR ?10 Dig II - Wrist 13    L Ulnar - Orthodromic, (Dig V, Mid palm)     Dig V Wrist 3.6 ?3.1 3 ?5 Dig V - Wrist 11    R Ulnar - Orthodromic, (Dig V, Mid palm)     Dig V Wrist 3.5 ?3.1 4 ?5 Dig V - Wrist 23                             F  Wave    Nerve F Lat Ref.   ms ms  L Tibial - AH NR ?56.0  L Ulnar - ADM 31.3 ?32.0  R Ulnar - ADM 30.6 ?32.0           EMG Summary Table    Spontaneous MUAP Recruitment  Muscle IA Fib PSW Fasc Other Amp Dur. Poly Pattern  R. Vastus medialis Normal None None None _______ Normal Normal Normal Reduced  R. Gastrocnemius (Medial head) Decreased None None None _______ Normal Normal Normal Normal  R. Tibialis anterior Normal None None None _______ Increased Normal 2+ Reduced  R. Extensor hallucis longus Decreased None 3+ None _______ Increased Normal 2+ Reduced  R. Abductor hallucis Decreased None 2+ None _______ Normal Normal Normal Reduced  R. Iliopsoas Normal None None None _______ Normal Normal Normal Normal  R. Cervical paraspinals (low) Normal None None None _______ Normal Normal Normal Normal  R. Deltoid Normal None None None _______ Normal Normal Normal Normal  R. Triceps brachii Normal None None None _______ Normal Normal Normal Normal  L. Triceps brachii Normal None None None _______ Normal Normal Normal Normal  L. Deltoid Normal None None None _______ Normal Normal Normal Normal  L. Pronator teres Normal None None None _______ Normal Normal Normal Normal  R. Pronator teres Normal None None None _______ Normal Normal Normal Normal  L. First dorsal interosseous Normal None None None _______ Normal Normal Normal Normal  R. First dorsal interosseous Normal None None None _______ Normal Normal Normal Normal  L. Opponens pollicis Normal None 1+  None _______ Normal Normal Normal Reduced  R. Opponens pollicis Normal None 3+ None _______ Increased Normal 3+ Reduced  L. Lumbar paraspinals (low) Normal None None None _______ Normal Normal Normal Normal  R. Lumbar paraspinals (low) Normal None None None _______ Normal Normal Normal Normal  L. Thoracic paraspinals (mid) Normal None None None _______ Normal Normal Normal Normal  R. Thoracic paraspinals (mid) Normal None None None _______ Normal Normal Normal Normal

## 2020-05-15 NOTE — Progress Notes (Signed)
See procedure note.

## 2020-05-16 ENCOUNTER — Telehealth: Payer: Self-pay | Admitting: Neurology

## 2020-05-16 NOTE — Procedures (Signed)
Full Name: Anna Figueroa Gender: Female MRN #: [id number] Date of Birth: Oct 03, 1950    Visit Date: 05/14/2020 12:40 Age: 70 Years Examining Physician: Sarina Ill, MD  Requesting Provider: Leanna Battles, MD Primary Care Provider:  Leanna Battles, MD  History: Patient with ataxia, painless sensory loss and weakness in dorsiflexion and plantar flexion; suspicion for sensory ataxia with distal weakness.   Summary: The right median APB motor nerve showed prolonged distal onset latency (5.9 ms, N<4.4) and reduced amplitude(0.8 mV, N>4) and decreased conduction velocity(82m/s,N>49). The left  median APB motor nerve showed no response.  The left and right peroneal motor nerves showed no response.  The left tibial motor nerve showed no response.  The left radial sensory nerve showed reduced amplitude (8 V, normal greater than 15).  The right radial sensory nerve showed reduced amplitude (7 V, normal greater than 15).  The left sural sensory nerve, left superficial peroneal sensory nerve, right superficial peroneal sensory nerve, left median orthodromic sensory nerve and right median orthodromic sensory nerve showed no response.  The left ulnar orthodromic sensory nerve showed delayed distal peak latency (3.6 ms, normal less than 3.1) and reduced amplitude (3 V, normal greater than 5).  The left ulnar orthodromic sensory nerve showed delayed distal peak latency (3.5 ms, normal less than 3.1) and reduced amplitude (4 V, normal greater than 5). F Wave studies indicate that the left tibial F wave showed no response.  The right median/ulnar (palm) comparison nerve showed prolonged distal peak latency (Median Palm, 7 ms, N<2.2) and abnormal peak latency difference (Median Palm-Ulnar Palm, 4.3 ms, N<0.4) with a relative median delay.    The left median/ulnar (palm) comparison nerve showed prolonged distal peak latency (Median Palm, 6.4 ms, N<2.2) and abnormal peak latency difference (Median  Palm-Ulnar Palm, 3.8 ms, N<0.4) with a relative median delay.    All remaining nerves (as indicated in the following tables) were within normal limits.    The right tibialis muscle showed increased motor unit amplitude, diminished motor unit recruitment and polyphasic motor units.  The right extensor houses longus showed increased spontaneous activity,increased motor unit amplitude, diminished motor unit recruitment and polyphasic motor units.  The right abductor hallucis showed increased spontaneous activity and diminished motor unit recruitment.  The left opponens pollicis muscle showed increased spontaneous activity and diminished motor unit recruitment.  The right opponens pollicis muscle showed increased spontaneous activity, increased motor unit amplitude, polyphasic motor units and diminished motor unit recruitment.  All remaining muscles (as indicated in the following tables) were within normal limits.      Conclusion: 1.  There is severe bilateral carpal tunnel syndrome. 2.  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   Sarina Ill M.D.  [facility] [address], [place], [place] [address] Tel: [phone] Fax: [phone]  Verbal informed consent was obtained from the patient, patient was informed of potential risk of procedure, including bruising, bleeding, hematoma formation, infection, muscle weakness, muscle pain, numbness, among others.        [place]    Nerve / Sites Muscle Latency Ref. Amplitude Ref. Rel Amp Segments Distance Velocity Ref. Area    ms ms mV mV %  cm m/s m/s mVms  L Median - APB     Wrist APB NR ?4.4 NR ?4.0 NR Wrist - APB 7   NR     Upper arm APB NR  NR  NR Upper arm - Wrist 21 NR ?  49 NR  R Median - APB     Wrist APB 5.9 ?4.4 0.8 ?4.0 100 Wrist - APB 7   6.2     Upper arm APB 11.1  0.7  85.2 Upper arm - Wrist 21 41 ?49 4.9  L Ulnar - ADM     Wrist ADM 3.1 ?3.3 6.8 ?5.0 100 Wrist - ADM 7   22.1      B.Elbow ADM 6.7  6.3  92.8 B.Elbow - Wrist 19 52 ?49 21.9     A.Elbow ADM 8.6  6.1  97.2 A.Elbow - B.Elbow 10 52 ?49 22.5  R Ulnar - ADM     Wrist ADM 3.2 ?3.3 5.7 ?5.0 100 Wrist - ADM 7   25.3     B.Elbow ADM 6.5  5.6  97.8 B.Elbow - Wrist 18 55 ?49 21.7     A.Elbow ADM 8.4  5.1  91.3 A.Elbow - B.Elbow 10 54 ?49 20.5  L Peroneal - EDB     Ankle EDB NR ?6.5 NR ?2.0 NR Ankle - EDB 9   NR     Fib head EDB NR  NR  NR Fib head - Ankle 29 NR ?44 NR     Pop fossa EDB NR  NR  NR Pop fossa - Fib head 10 NR ?44 NR         Pop fossa - Ankle      R Peroneal - EDB     Ankle EDB NR ?6.5 NR ?2.0 NR Ankle - EDB 9   NR     Fib head EDB NR  NR  NR Fib head - Ankle 29 NR ?44 NR     Pop fossa EDB NR  NR  NR Pop fossa - Fib head 10 NR ?44 NR         Pop fossa - Ankle      L Tibial - AH     Ankle AH NR ?5.8 NR ?4.0 NR Ankle - AH 9   NR     Pop fossa AH NR  NR  NR Pop fossa - Ankle 37 NR ?41 NR                   SSR    Nerve / Sites Latency   s  L Sympathetic - Foot     Foot 1.35         SNC    Nerve / Sites Rec. Site Peak Lat Ref.  Amp Ref. Segments Distance Peak Diff Ref.    ms ms V V  cm ms ms  L Radial - Anatomical snuff box (Forearm)     Forearm Wrist 2.7 ?2.9 8 ?15 Forearm - Wrist 10    R Radial - Anatomical snuff box (Forearm)     Forearm Wrist 2.9 ?2.9 7 ?15 Forearm - Wrist 10    L Sural - Ankle (Calf)     Calf Ankle NR ?4.4 NR ?6 Calf - Ankle 14    L Superficial peroneal - Ankle     Lat leg Ankle NR ?4.4 NR ?6 Lat leg - Ankle 14    R Superficial peroneal - Ankle     Lat leg Ankle NR ?4.4 NR ?6 Lat leg - Ankle 14    L Median, Ulnar - Transcarpal comparison     Median Palm Wrist 6.4 ?2.2 7 ?35 Median Palm - Wrist 8       Ulnar Palm Wrist 2.6 ?2.2 20 ?12  Ulnar Palm - Wrist 8          Median Palm - Ulnar Palm  3.8 ?0.4  R Median, Ulnar - Transcarpal comparison     Median Palm Wrist 7.0 ?2.2 8 ?35 Median Palm - Wrist 8       Ulnar Palm Wrist 2.7 ?2.2 15 ?12 Ulnar Palm - Wrist 8           Median Palm - Ulnar Palm  4.3 ?0.4  L Median - Orthodromic (Dig II, Mid palm)     Dig II Wrist NR ?3.4 NR ?10 Dig II - Wrist 13    R Median - Orthodromic (Dig II, Mid palm)     Dig II Wrist NR ?3.4 NR ?10 Dig II - Wrist 13    L Ulnar - Orthodromic, (Dig V, Mid palm)     Dig V Wrist 3.6 ?3.1 3 ?5 Dig V - Wrist 11    R Ulnar - Orthodromic, (Dig V, Mid palm)     Dig V Wrist 3.5 ?3.1 4 ?5 Dig V - Wrist 35                             F  Wave    Nerve F Lat Ref.   ms ms  L Tibial - AH NR ?56.0  L Ulnar - ADM 31.3 ?32.0  R Ulnar - ADM 30.6 ?32.0           EMG Summary Table    Spontaneous MUAP Recruitment  Muscle IA Fib PSW Fasc Other Amp Dur. Poly Pattern  R. Vastus medialis Normal None None None _______ Normal Normal Normal Reduced  R. Gastrocnemius (Medial head) Decreased None None None _______ Normal Normal Normal Normal  R. Tibialis anterior Normal None None None _______ Increased Normal 2+ Reduced  R. Extensor hallucis longus Decreased None 3+ None _______ Increased Normal 2+ Reduced  R. Abductor hallucis Decreased None 2+ None _______ Normal Normal Normal Reduced  R. Iliopsoas Normal None None None _______ Normal Normal Normal Normal  R. Cervical paraspinals (low) Normal None None None _______ Normal Normal Normal Normal  R. Deltoid Normal None None None _______ Normal Normal Normal Normal  R. Triceps brachii Normal None None None _______ Normal Normal Normal Normal  L. Triceps brachii Normal None None None _______ Normal Normal Normal Normal  L. Deltoid Normal None None None _______ Normal Normal Normal Normal  L. Pronator teres Normal None None None _______ Normal Normal Normal Normal  R. Pronator teres Normal None None None _______ Normal Normal Normal Normal  L. First dorsal interosseous Normal None None None _______ Normal Normal Normal Normal  R. First dorsal interosseous Normal None None None _______ Normal Normal Normal Normal  L. Opponens pollicis Normal None 1+  None _______ Normal Normal Normal Reduced  R. Opponens pollicis Normal None 3+ None _______ Increased Normal 3+ Reduced  L. Lumbar paraspinals (low) Normal None None None _______ Normal Normal Normal Normal  R. Lumbar paraspinals (low) Normal None None None _______ Normal Normal Normal Normal  L. Thoracic paraspinals (mid) Normal None None None _______ Normal Normal Normal Normal  R. Thoracic paraspinals (mid) Normal None None None _______ Normal Normal Normal Normal

## 2020-05-16 NOTE — Telephone Encounter (Signed)
[employer] medicare order sent to [facility]. No auth they will reach out to the patient to schedule.  

## 2020-05-22 DIAGNOSIS — G5693 Unspecified mononeuropathy of bilateral upper limbs: Secondary | ICD-10-CM | POA: Diagnosis not present

## 2020-05-22 DIAGNOSIS — Z85038 Personal history of other malignant neoplasm of large intestine: Secondary | ICD-10-CM | POA: Diagnosis not present

## 2020-05-22 DIAGNOSIS — Z9049 Acquired absence of other specified parts of digestive tract: Secondary | ICD-10-CM | POA: Diagnosis not present

## 2020-05-22 DIAGNOSIS — Z08 Encounter for follow-up examination after completed treatment for malignant neoplasm: Secondary | ICD-10-CM | POA: Diagnosis not present

## 2020-05-22 DIAGNOSIS — C49A4 Gastrointestinal stromal tumor of large intestine: Secondary | ICD-10-CM | POA: Diagnosis not present

## 2020-05-22 DIAGNOSIS — G5793 Unspecified mononeuropathy of bilateral lower limbs: Secondary | ICD-10-CM | POA: Diagnosis not present

## 2020-05-22 DIAGNOSIS — C49A0 Gastrointestinal stromal tumor, unspecified site: Secondary | ICD-10-CM | POA: Diagnosis not present

## 2020-05-22 DIAGNOSIS — C189 Malignant neoplasm of colon, unspecified: Secondary | ICD-10-CM | POA: Diagnosis not present

## 2020-05-22 DIAGNOSIS — Z90722 Acquired absence of ovaries, bilateral: Secondary | ICD-10-CM | POA: Diagnosis not present

## 2020-05-23 ENCOUNTER — Encounter: Payer: Self-pay | Admitting: Neurology

## 2020-05-25 LAB — COPPER, SERUM: Copper: 116 ug/dL (ref 80–158)

## 2020-05-25 LAB — VITAMIN E
Vitamin E (Alpha Tocopherol): 10.9 mg/L (ref 9.0–29.0)
Vitamin E(Gamma Tocopherol): 3.6 mg/L (ref 0.5–4.9)

## 2020-06-03 ENCOUNTER — Other Ambulatory Visit: Payer: Medicare Other

## 2020-06-07 ENCOUNTER — Ambulatory Visit
Admission: RE | Admit: 2020-06-07 | Discharge: 2020-06-07 | Disposition: A | Discharge status: Home / Self Care | Payer: Medicare Other | Source: Ambulatory Visit | Attending: Neurology | Admitting: Neurology

## 2020-06-07 ENCOUNTER — Other Ambulatory Visit: Payer: Self-pay

## 2020-06-07 DIAGNOSIS — R29898 Other symptoms and signs involving the musculoskeletal system: Secondary | ICD-10-CM

## 2020-06-07 DIAGNOSIS — R270 Ataxia, unspecified: Secondary | ICD-10-CM

## 2020-06-07 DIAGNOSIS — M6281 Muscle weakness (generalized): Secondary | ICD-10-CM

## 2020-06-07 DIAGNOSIS — R200 Anesthesia of skin: Secondary | ICD-10-CM

## 2020-06-07 DIAGNOSIS — W19XXXD Unspecified fall, subsequent encounter: Secondary | ICD-10-CM

## 2020-06-07 DIAGNOSIS — R202 Paresthesia of skin: Secondary | ICD-10-CM

## 2020-06-07 MED ORDER — GADOBENATE DIMEGLUMINE 529 MG/ML IV SOLN
12.0000 mL | Freq: Once | INTRAVENOUS | Status: AC | PRN
  Administered 2020-06-07: 12 mL via INTRAVENOUS

## 2020-06-28 ENCOUNTER — Telehealth: Payer: Self-pay | Admitting: Neurology

## 2020-06-28 NOTE — Telephone Encounter (Signed)
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 This can be a cause of neuropathy. I would like her to call the genetic counselors and also bring the results to [facility] when she sees them, unknown clinical significance.

## 2020-07-02 NOTE — Telephone Encounter (Signed)
Called pt and LVM (ok per DPR) asking for call back to discuss results of [facility] genetic testing.

## 2020-07-02 NOTE — Telephone Encounter (Signed)
The patient returned my call.  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 Patient is aware to call genetic counselors and bring results to [facility] when she sees them.  She has advised that it is okay to mail results to her home and I confirmed her address on file is correct.  The patient stated she will follow-up.  Results mailed to pt's home with phone number and contact hours to call a genetic counselor.

## 2020-08-01 DIAGNOSIS — G608 Other hereditary and idiopathic neuropathies: Secondary | ICD-10-CM | POA: Diagnosis not present

## 2020-08-01 DIAGNOSIS — R278 Other lack of coordination: Secondary | ICD-10-CM | POA: Diagnosis not present

## 2020-08-20 ENCOUNTER — Ambulatory Visit: Payer: Medicare Other | Admitting: Neurology

## 2020-08-21 DIAGNOSIS — C49A4 Gastrointestinal stromal tumor of large intestine: Secondary | ICD-10-CM | POA: Diagnosis not present

## 2020-08-21 DIAGNOSIS — C49A0 Gastrointestinal stromal tumor, unspecified site: Secondary | ICD-10-CM | POA: Diagnosis not present

## 2020-08-21 DIAGNOSIS — C189 Malignant neoplasm of colon, unspecified: Secondary | ICD-10-CM | POA: Diagnosis not present

## 2020-08-22 DIAGNOSIS — G64 Other disorders of peripheral nervous system: Secondary | ICD-10-CM | POA: Diagnosis not present

## 2020-08-22 DIAGNOSIS — Z933 Colostomy status: Secondary | ICD-10-CM | POA: Diagnosis not present

## 2020-08-22 DIAGNOSIS — C499 Malignant neoplasm of connective and soft tissue, unspecified: Secondary | ICD-10-CM | POA: Diagnosis not present

## 2020-08-22 DIAGNOSIS — G5603 Carpal tunnel syndrome, bilateral upper limbs: Secondary | ICD-10-CM | POA: Diagnosis not present

## 2020-09-14 DIAGNOSIS — G5601 Carpal tunnel syndrome, right upper limb: Secondary | ICD-10-CM | POA: Diagnosis not present

## 2020-09-28 DIAGNOSIS — M79641 Pain in right hand: Secondary | ICD-10-CM | POA: Diagnosis not present

## 2020-10-02 ENCOUNTER — Other Ambulatory Visit: Payer: Self-pay | Admitting: Internal Medicine

## 2020-10-02 DIAGNOSIS — Z1231 Encounter for screening mammogram for malignant neoplasm of breast: Secondary | ICD-10-CM

## 2020-10-05 ENCOUNTER — Other Ambulatory Visit: Payer: Self-pay

## 2020-10-05 ENCOUNTER — Ambulatory Visit
Admission: RE | Admit: 2020-10-05 | Discharge: 2020-10-05 | Disposition: A | Discharge status: Home / Self Care | Payer: Medicare Other | Source: Ambulatory Visit | Attending: Internal Medicine | Admitting: Internal Medicine

## 2020-10-05 DIAGNOSIS — Z1231 Encounter for screening mammogram for malignant neoplasm of breast: Secondary | ICD-10-CM

## 2020-10-11 ENCOUNTER — Other Ambulatory Visit: Payer: Self-pay | Admitting: Internal Medicine

## 2020-10-11 DIAGNOSIS — R928 Other abnormal and inconclusive findings on diagnostic imaging of breast: Secondary | ICD-10-CM

## 2020-10-19 ENCOUNTER — Other Ambulatory Visit: Payer: Self-pay

## 2020-10-19 ENCOUNTER — Ambulatory Visit: Admission: RE | Admit: 2020-10-19 | Payer: Medicare Other | Source: Ambulatory Visit

## 2020-10-19 ENCOUNTER — Ambulatory Visit
Admission: RE | Admit: 2020-10-19 | Discharge: 2020-10-19 | Disposition: A | Discharge status: Home / Self Care | Payer: Medicare Other | Source: Ambulatory Visit | Attending: Internal Medicine | Admitting: Internal Medicine

## 2020-10-19 DIAGNOSIS — R928 Other abnormal and inconclusive findings on diagnostic imaging of breast: Secondary | ICD-10-CM

## 2020-10-19 DIAGNOSIS — R922 Inconclusive mammogram: Secondary | ICD-10-CM | POA: Diagnosis not present

## 2020-10-23 DIAGNOSIS — C49A4 Gastrointestinal stromal tumor of large intestine: Secondary | ICD-10-CM | POA: Diagnosis not present

## 2020-10-24 DIAGNOSIS — H2513 Age-related nuclear cataract, bilateral: Secondary | ICD-10-CM | POA: Diagnosis not present

## 2020-10-24 DIAGNOSIS — H43813 Vitreous degeneration, bilateral: Secondary | ICD-10-CM | POA: Diagnosis not present

## 2020-10-24 DIAGNOSIS — H353131 Nonexudative age-related macular degeneration, bilateral, early dry stage: Secondary | ICD-10-CM | POA: Diagnosis not present

## 2020-10-24 DIAGNOSIS — B0239 Other herpes zoster eye disease: Secondary | ICD-10-CM | POA: Diagnosis not present

## 2020-10-31 DIAGNOSIS — H2513 Age-related nuclear cataract, bilateral: Secondary | ICD-10-CM | POA: Diagnosis not present

## 2020-10-31 DIAGNOSIS — H43813 Vitreous degeneration, bilateral: Secondary | ICD-10-CM | POA: Diagnosis not present

## 2020-10-31 DIAGNOSIS — B0239 Other herpes zoster eye disease: Secondary | ICD-10-CM | POA: Diagnosis not present

## 2020-10-31 DIAGNOSIS — H353131 Nonexudative age-related macular degeneration, bilateral, early dry stage: Secondary | ICD-10-CM | POA: Diagnosis not present

## 2021-01-11 DIAGNOSIS — Z933 Colostomy status: Secondary | ICD-10-CM | POA: Diagnosis not present

## 2021-03-12 DIAGNOSIS — E079 Disorder of thyroid, unspecified: Secondary | ICD-10-CM | POA: Diagnosis not present

## 2021-03-12 DIAGNOSIS — G629 Polyneuropathy, unspecified: Secondary | ICD-10-CM | POA: Diagnosis not present

## 2021-03-12 DIAGNOSIS — C49A4 Gastrointestinal stromal tumor of large intestine: Secondary | ICD-10-CM | POA: Diagnosis not present

## 2021-03-12 DIAGNOSIS — C49A0 Gastrointestinal stromal tumor, unspecified site: Secondary | ICD-10-CM | POA: Diagnosis not present

## 2021-03-12 DIAGNOSIS — C189 Malignant neoplasm of colon, unspecified: Secondary | ICD-10-CM | POA: Diagnosis not present

## 2021-03-19 DIAGNOSIS — R739 Hyperglycemia, unspecified: Secondary | ICD-10-CM | POA: Diagnosis not present

## 2021-03-19 DIAGNOSIS — E559 Vitamin D deficiency, unspecified: Secondary | ICD-10-CM | POA: Diagnosis not present

## 2021-03-19 DIAGNOSIS — I10 Essential (primary) hypertension: Secondary | ICD-10-CM | POA: Diagnosis not present

## 2021-03-19 DIAGNOSIS — E039 Hypothyroidism, unspecified: Secondary | ICD-10-CM | POA: Diagnosis not present

## 2021-04-10 DIAGNOSIS — Z933 Colostomy status: Secondary | ICD-10-CM | POA: Diagnosis not present

## 2021-04-29 DIAGNOSIS — H43813 Vitreous degeneration, bilateral: Secondary | ICD-10-CM | POA: Diagnosis not present

## 2021-04-29 DIAGNOSIS — H353131 Nonexudative age-related macular degeneration, bilateral, early dry stage: Secondary | ICD-10-CM | POA: Diagnosis not present

## 2021-04-29 DIAGNOSIS — H2513 Age-related nuclear cataract, bilateral: Secondary | ICD-10-CM | POA: Diagnosis not present

## 2021-04-29 DIAGNOSIS — H11823 Conjunctivochalasis, bilateral: Secondary | ICD-10-CM | POA: Diagnosis not present

## 2021-05-07 DIAGNOSIS — E039 Hypothyroidism, unspecified: Secondary | ICD-10-CM | POA: Diagnosis not present

## 2021-09-24 DIAGNOSIS — C786 Secondary malignant neoplasm of retroperitoneum and peritoneum: Secondary | ICD-10-CM | POA: Diagnosis not present

## 2021-09-24 DIAGNOSIS — C49A4 Gastrointestinal stromal tumor of large intestine: Secondary | ICD-10-CM | POA: Diagnosis not present

## 2021-09-24 DIAGNOSIS — E079 Disorder of thyroid, unspecified: Secondary | ICD-10-CM | POA: Diagnosis not present

## 2021-09-24 DIAGNOSIS — Z09 Encounter for follow-up examination after completed treatment for conditions other than malignant neoplasm: Secondary | ICD-10-CM | POA: Diagnosis not present

## 2021-09-26 DIAGNOSIS — E559 Vitamin D deficiency, unspecified: Secondary | ICD-10-CM | POA: Diagnosis not present

## 2021-09-26 DIAGNOSIS — E039 Hypothyroidism, unspecified: Secondary | ICD-10-CM | POA: Diagnosis not present

## 2021-09-26 DIAGNOSIS — G609 Hereditary and idiopathic neuropathy, unspecified: Secondary | ICD-10-CM | POA: Diagnosis not present

## 2021-09-26 DIAGNOSIS — C49A4 Gastrointestinal stromal tumor of large intestine: Secondary | ICD-10-CM | POA: Diagnosis not present

## 2021-11-12 DIAGNOSIS — E039 Hypothyroidism, unspecified: Secondary | ICD-10-CM | POA: Diagnosis not present

## 2021-12-17 DIAGNOSIS — Z933 Colostomy status: Secondary | ICD-10-CM | POA: Diagnosis not present

## 2021-12-24 DIAGNOSIS — C49A4 Gastrointestinal stromal tumor of large intestine: Secondary | ICD-10-CM | POA: Diagnosis not present

## 2021-12-31 DIAGNOSIS — Z1231 Encounter for screening mammogram for malignant neoplasm of breast: Secondary | ICD-10-CM | POA: Diagnosis not present

## 2022-03-20 DIAGNOSIS — Z933 Colostomy status: Secondary | ICD-10-CM | POA: Diagnosis not present

## 2022-05-05 DIAGNOSIS — H11823 Conjunctivochalasis, bilateral: Secondary | ICD-10-CM | POA: Diagnosis not present

## 2022-05-05 DIAGNOSIS — H2513 Age-related nuclear cataract, bilateral: Secondary | ICD-10-CM | POA: Diagnosis not present

## 2022-05-05 DIAGNOSIS — H524 Presbyopia: Secondary | ICD-10-CM | POA: Diagnosis not present

## 2022-05-05 DIAGNOSIS — H43813 Vitreous degeneration, bilateral: Secondary | ICD-10-CM | POA: Diagnosis not present

## 2022-05-05 DIAGNOSIS — H353131 Nonexudative age-related macular degeneration, bilateral, early dry stage: Secondary | ICD-10-CM | POA: Diagnosis not present

## 2022-05-20 DIAGNOSIS — E039 Hypothyroidism, unspecified: Secondary | ICD-10-CM | POA: Diagnosis not present

## 2022-05-20 DIAGNOSIS — Z1212 Encounter for screening for malignant neoplasm of rectum: Secondary | ICD-10-CM | POA: Diagnosis not present

## 2022-05-20 DIAGNOSIS — R5383 Other fatigue: Secondary | ICD-10-CM | POA: Diagnosis not present

## 2022-05-20 DIAGNOSIS — I10 Essential (primary) hypertension: Secondary | ICD-10-CM | POA: Diagnosis not present

## 2022-05-27 DIAGNOSIS — E039 Hypothyroidism, unspecified: Secondary | ICD-10-CM | POA: Diagnosis not present

## 2022-05-27 DIAGNOSIS — R82998 Other abnormal findings in urine: Secondary | ICD-10-CM | POA: Diagnosis not present

## 2022-05-27 DIAGNOSIS — I10 Essential (primary) hypertension: Secondary | ICD-10-CM | POA: Diagnosis not present

## 2022-05-27 DIAGNOSIS — Z0000 Encounter for general adult medical examination without abnormal findings: Secondary | ICD-10-CM | POA: Diagnosis not present

## 2022-06-24 DIAGNOSIS — J9811 Atelectasis: Secondary | ICD-10-CM | POA: Diagnosis not present

## 2022-06-24 DIAGNOSIS — G629 Polyneuropathy, unspecified: Secondary | ICD-10-CM | POA: Diagnosis not present

## 2022-06-24 DIAGNOSIS — Z8509 Personal history of malignant neoplasm of other digestive organs: Secondary | ICD-10-CM | POA: Diagnosis not present

## 2022-06-24 DIAGNOSIS — Z08 Encounter for follow-up examination after completed treatment for malignant neoplasm: Secondary | ICD-10-CM | POA: Diagnosis not present

## 2022-06-24 DIAGNOSIS — C49A4 Gastrointestinal stromal tumor of large intestine: Secondary | ICD-10-CM | POA: Diagnosis not present

## 2022-06-24 DIAGNOSIS — Z9049 Acquired absence of other specified parts of digestive tract: Secondary | ICD-10-CM | POA: Diagnosis not present

## 2022-06-24 DIAGNOSIS — Z933 Colostomy status: Secondary | ICD-10-CM | POA: Diagnosis not present

## 2022-06-24 DIAGNOSIS — R5383 Other fatigue: Secondary | ICD-10-CM | POA: Diagnosis not present

## 2022-07-04 DIAGNOSIS — R062 Wheezing: Secondary | ICD-10-CM | POA: Diagnosis not present

## 2022-07-04 DIAGNOSIS — R071 Chest pain on breathing: Secondary | ICD-10-CM | POA: Diagnosis not present

## 2022-08-23 DIAGNOSIS — Z933 Colostomy status: Secondary | ICD-10-CM | POA: Diagnosis not present

## 2022-09-18 DIAGNOSIS — H93293 Other abnormal auditory perceptions, bilateral: Secondary | ICD-10-CM | POA: Diagnosis not present

## 2022-09-18 DIAGNOSIS — H6121 Impacted cerumen, right ear: Secondary | ICD-10-CM | POA: Diagnosis not present

## 2022-09-23 DIAGNOSIS — C49A4 Gastrointestinal stromal tumor of large intestine: Secondary | ICD-10-CM | POA: Diagnosis not present

## 2022-10-02 DIAGNOSIS — H903 Sensorineural hearing loss, bilateral: Secondary | ICD-10-CM | POA: Diagnosis not present

## 2023-01-10 DIAGNOSIS — Z933 Colostomy status: Secondary | ICD-10-CM | POA: Diagnosis not present

## 2023-03-03 DIAGNOSIS — C49A4 Gastrointestinal stromal tumor of large intestine: Secondary | ICD-10-CM | POA: Diagnosis not present

## 2023-03-03 DIAGNOSIS — C189 Malignant neoplasm of colon, unspecified: Secondary | ICD-10-CM | POA: Diagnosis not present

## 2023-03-11 DIAGNOSIS — Z1231 Encounter for screening mammogram for malignant neoplasm of breast: Secondary | ICD-10-CM | POA: Diagnosis not present

## 2023-05-11 DIAGNOSIS — H2513 Age-related nuclear cataract, bilateral: Secondary | ICD-10-CM | POA: Diagnosis not present

## 2023-05-11 DIAGNOSIS — H353131 Nonexudative age-related macular degeneration, bilateral, early dry stage: Secondary | ICD-10-CM | POA: Diagnosis not present

## 2023-05-11 DIAGNOSIS — H11823 Conjunctivochalasis, bilateral: Secondary | ICD-10-CM | POA: Diagnosis not present

## 2023-05-11 DIAGNOSIS — H43813 Vitreous degeneration, bilateral: Secondary | ICD-10-CM | POA: Diagnosis not present

## 2023-05-26 DIAGNOSIS — Z933 Colostomy status: Secondary | ICD-10-CM | POA: Diagnosis not present

## 2023-05-30 DIAGNOSIS — Z933 Colostomy status: Secondary | ICD-10-CM | POA: Diagnosis not present

## 2023-06-02 DIAGNOSIS — E559 Vitamin D deficiency, unspecified: Secondary | ICD-10-CM | POA: Diagnosis not present

## 2023-06-02 DIAGNOSIS — E039 Hypothyroidism, unspecified: Secondary | ICD-10-CM | POA: Diagnosis not present

## 2023-06-02 DIAGNOSIS — R739 Hyperglycemia, unspecified: Secondary | ICD-10-CM | POA: Diagnosis not present

## 2023-06-09 DIAGNOSIS — R82998 Other abnormal findings in urine: Secondary | ICD-10-CM | POA: Diagnosis not present

## 2023-06-09 DIAGNOSIS — E039 Hypothyroidism, unspecified: Secondary | ICD-10-CM | POA: Diagnosis not present

## 2023-06-09 DIAGNOSIS — Z0000 Encounter for general adult medical examination without abnormal findings: Secondary | ICD-10-CM | POA: Diagnosis not present

## 2023-09-29 DIAGNOSIS — C49A4 Gastrointestinal stromal tumor of large intestine: Secondary | ICD-10-CM | POA: Diagnosis not present

## 2023-09-29 DIAGNOSIS — C189 Malignant neoplasm of colon, unspecified: Secondary | ICD-10-CM | POA: Diagnosis not present

## 2023-09-29 DIAGNOSIS — C49A3 Gastrointestinal stromal tumor of small intestine: Secondary | ICD-10-CM | POA: Diagnosis not present
